# Patient Record
Sex: Female | Born: 1960 | Race: White | Hispanic: No | Smoking: Never smoker
Health system: Southern US, Community
[De-identification: ages and names within clinical notes are randomized; demographics above are authoritative.]

## PROBLEM LIST (undated history)

## (undated) DIAGNOSIS — R519 Headache, unspecified: Secondary | ICD-10-CM

## (undated) DIAGNOSIS — R1310 Dysphagia, unspecified: Secondary | ICD-10-CM

---

## 2004-06-04 ENCOUNTER — Emergency Department: Payer: Self-pay | Admitting: Emergency Medicine

## 2005-04-17 ENCOUNTER — Emergency Department: Payer: Self-pay | Admitting: Emergency Medicine

## 2005-04-18 ENCOUNTER — Other Ambulatory Visit: Payer: Self-pay

## 2006-07-04 ENCOUNTER — Other Ambulatory Visit: Payer: Self-pay

## 2006-07-04 ENCOUNTER — Emergency Department: Payer: Self-pay | Admitting: Emergency Medicine

## 2009-08-20 ENCOUNTER — Ambulatory Visit: Payer: Self-pay | Admitting: Internal Medicine

## 2009-09-03 ENCOUNTER — Ambulatory Visit: Payer: Self-pay | Admitting: Internal Medicine

## 2009-11-16 ENCOUNTER — Emergency Department: Payer: Self-pay | Admitting: Emergency Medicine

## 2010-01-22 ENCOUNTER — Ambulatory Visit: Payer: Self-pay | Admitting: Internal Medicine

## 2010-08-26 ENCOUNTER — Ambulatory Visit: Payer: Self-pay | Admitting: Internal Medicine

## 2011-10-15 ENCOUNTER — Emergency Department: Payer: Self-pay | Admitting: Unknown Physician Specialty

## 2011-10-15 LAB — URINALYSIS, COMPLETE
Blood: NEGATIVE
Ketone: NEGATIVE
Leukocyte Esterase: NEGATIVE
Nitrite: NEGATIVE
Ph: 5 (ref 4.5–8.0)
Protein: NEGATIVE
RBC,UR: 1 /HPF (ref 0–5)
Specific Gravity: 1.02 (ref 1.003–1.030)
Squamous Epithelial: 4

## 2011-10-15 LAB — COMPREHENSIVE METABOLIC PANEL
Alkaline Phosphatase: 156 U/L — ABNORMAL HIGH (ref 50–136)
Anion Gap: 9 (ref 7–16)
BUN: 8 mg/dL (ref 7–18)
Calcium, Total: 9.2 mg/dL (ref 8.5–10.1)
Chloride: 105 mmol/L (ref 98–107)
EGFR (Non-African Amer.): >60
Glucose: 107 mg/dL — ABNORMAL HIGH (ref 65–99)
Osmolality: 282 (ref 275–301)
SGOT(AST): 28 U/L (ref 15–37)
SGPT (ALT): 30 U/L
Sodium: 142 mmol/L (ref 136–145)
Total Protein: 7.6 g/dL (ref 6.4–8.2)

## 2011-10-15 LAB — CBC WITH DIFFERENTIAL/PLATELET
Basophil %: 0.4 %
Eosinophil %: 4.7 %
HCT: 41 % (ref 35.0–47.0)
Lymphocyte #: 1.7 10*3/uL (ref 1.0–3.6)
Lymphocyte %: 27.5 %
MCH: 30.6 pg (ref 26.0–34.0)
MCHC: 34 g/dL (ref 32.0–36.0)
MCV: 90 fL (ref 80–100)
Monocyte #: 0.4 10*3/uL (ref 0.0–0.7)
Monocyte %: 7 %
Neutrophil #: 3.7 10*3/uL (ref 1.4–6.5)
Neutrophil %: 60.4 %
Platelet: 224 10*3/uL (ref 150–440)
RDW: 12.8 % (ref 11.5–14.5)

## 2011-10-16 LAB — PRO B NATRIURETIC PEPTIDE: B-Type Natriuretic Peptide: 57 pg/mL (ref 0–125)

## 2012-05-12 ENCOUNTER — Ambulatory Visit: Payer: Self-pay

## 2013-11-18 ENCOUNTER — Ambulatory Visit: Payer: Self-pay | Admitting: Emergency Medicine

## 2015-07-31 ENCOUNTER — Ambulatory Visit
Admission: EM | Admit: 2015-07-31 | Discharge: 2015-07-31 | Disposition: A | Discharge status: Home / Self Care | Payer: BLUE CROSS/BLUE SHIELD | Attending: Family Medicine | Admitting: Family Medicine

## 2015-07-31 ENCOUNTER — Ambulatory Visit (INDEPENDENT_AMBULATORY_CARE_PROVIDER_SITE_OTHER): Payer: BLUE CROSS/BLUE SHIELD

## 2015-07-31 ENCOUNTER — Encounter: Payer: Self-pay | Admitting: *Deleted

## 2015-07-31 DIAGNOSIS — R059 Cough, unspecified: Secondary | ICD-10-CM

## 2015-07-31 DIAGNOSIS — R05 Cough: Secondary | ICD-10-CM

## 2015-07-31 MED ORDER — PREDNISONE 20 MG PO TABS: 20.0000 mg | ORAL_TABLET | Freq: Every day | ORAL | Status: DC

## 2015-07-31 MED ORDER — GUAIFENESIN-CODEINE 100-10 MG/5ML PO SOLN: ORAL | Status: DC

## 2015-07-31 MED ORDER — AZITHROMYCIN 250 MG PO TABS: ORAL_TABLET | ORAL | Status: DC

## 2015-07-31 NOTE — ED Notes (Signed)
Pt states that she has a headache, sore throat, cough, back pain and chest pain.  Pt states that she has already been given 3 rounds of antibiotics, minocycline, in the past 4 weeks.

## 2015-07-31 NOTE — ED Provider Notes (Signed)
CSN: [id number]     Arrival date & time 07/31/15  1834 History   First MD Initiated Contact with Patient 07/31/15 1911     Chief Complaint  Patient presents with  . Cough   (Consider location/radiation/quality/duration/timing/severity/associated sxs/prior Treatment) Patient is a 55 y.o. female presenting with URI. The history is provided by the patient.  URI Presenting symptoms: cough, fatigue and fever   Severity:  Moderate Onset quality:  Sudden Duration:  3 weeks Timing:  Constant Progression:  Unchanged Chronicity:  New Ineffective treatments:  Prescription medications and OTC medications (states has done 3 rounds of minocycline antibiotic) Associated symptoms: headaches and wheezing (mild)   Associated symptoms: no sinus pain and no sneezing     History reviewed. No pertinent past medical history. History reviewed. No pertinent past surgical history. No family history on file. Social History  Substance Use Topics  . Smoking status: Never Smoker   . Smokeless tobacco: None  . Alcohol Use: No   OB History    No data available     Review of Systems  Constitutional: Positive for fever and fatigue.  HENT: Negative for sneezing.   Respiratory: Positive for cough and wheezing (mild).   Neurological: Positive for headaches.    Allergies  Penicillins  Home Medications   Prior to Admission medications   Medication Sig Start Date End Date Taking? Authorizing Provider  minocycline (DYNACIN) 100 MG tablet Take 100 mg by mouth 2 (two) times daily.   Yes Historical Provider, MD  azithromycin (ZITHROMAX Z-PAK) 250 MG tablet 2 tabs po once day 1, then 1 tab po qd for next 4 days 07/31/15   Payton Mccallum, MD  guaiFENesin-codeine 100-10 MG/5ML syrup 10 ml po q 8 hours prn 07/31/15   Payton Mccallum, MD  predniSONE (DELTASONE) 20 MG tablet Take 1 tablet (20 mg total) by mouth daily. 07/31/15   Payton Mccallum, MD   Meds Ordered and Administered this Visit  Medications - No data to  display  BP 151/86 mmHg  Pulse 100  Temp(Src) 100.9 F (38.3 C) (Tympanic)  Ht 5\' 5"  (1.651 m)  Wt 190 lb (86.183 kg)  BMI 31.62 kg/m2  SpO2 94%  LMP  No data found.   Physical Exam  Constitutional: She appears well-developed and well-nourished. No distress.  HENT:  Head: Normocephalic and atraumatic.  Right Ear: Tympanic membrane, external ear and ear canal normal.  Left Ear: Tympanic membrane, external ear and ear canal normal.  Nose: No nose lacerations, sinus tenderness, nasal deformity, septal deviation or nasal septal hematoma. No epistaxis.  No foreign bodies.  Mouth/Throat: Uvula is midline, oropharynx is clear and moist and mucous membranes are normal. No oropharyngeal exudate, posterior oropharyngeal edema, posterior oropharyngeal erythema or tonsillar abscesses.  Eyes: Conjunctivae and EOM are normal. Pupils are equal, round, and reactive to light. Right eye exhibits no discharge. Left eye exhibits no discharge. No scleral icterus.  Neck: Normal range of motion. Neck supple. No thyromegaly present.  Cardiovascular: Normal rate, regular rhythm and normal heart sounds.   Pulmonary/Chest: Effort normal. No respiratory distress. She has no wheezes. She has no rales.  Diffuse rhonchi and mild expiratory wheezes bilaterally  Lymphadenopathy:    She has no cervical adenopathy.  Skin: She is not diaphoretic.  Nursing note and vitals reviewed.   ED Course  Procedures (including critical care time)  Labs Review Labs Reviewed - No data to display  Imaging Review Dg Chest 2 View  07/31/2015  CLINICAL DATA:  Productive cough.  Fever. Chest tightness. Wheezing and shortness of breath for 4 weeks. EXAM: CHEST  2 VIEW COMPARISON:  None. FINDINGS: The heart size and mediastinal contours are within normal limits. Both lungs are clear. No evidence of pneumothorax or pleural effusion. Thoracic spine degenerative changes noted. IMPRESSION: No active cardiopulmonary disease.  Electronically Signed   By: Myles RosenthalJohn  Stahl M.D.   On: 07/31/2015 19:40     Visual Acuity Review  Right Eye Distance:   Left Eye Distance:   Bilateral Distance:    Right Eye Near:   Left Eye Near:    Bilateral Near:         MDM   1. Cough    Discharge Medication List as of 07/31/2015  8:01 PM    START taking these medications   Details  azithromycin (ZITHROMAX Z-PAK) 250 MG tablet 2 tabs po once day 1, then 1 tab po qd for next 4 days, Print    guaiFENesin-codeine 100-10 MG/5ML syrup 10 ml po q 8 hours prn, Print    predniSONE (DELTASONE) 20 MG tablet Take 1 tablet (20 mg total) by mouth daily., Starting 07/31/2015, Until Discontinued, Print       1. x-ray results and diagnosis reviewed with patient 2. rx as per orders above; reviewed possible side effects, interactions, risks and benefits  3. Recommend supportive treatment with rest, increased fluids; continue albuterol inhaler prn 4. Follow-up prn if symptoms worsen or don't improve     Payton Mccallumrlando Tuesday Terlecki, MD 07/31/15 2033

## 2015-11-27 ENCOUNTER — Encounter: Payer: Self-pay | Admitting: Gynecology

## 2015-11-27 ENCOUNTER — Ambulatory Visit
Admission: EM | Admit: 2015-11-27 | Discharge: 2015-11-27 | Disposition: A | Discharge status: Home / Self Care | Payer: BLUE CROSS/BLUE SHIELD | Attending: Family Medicine | Admitting: Family Medicine

## 2015-11-27 ENCOUNTER — Ambulatory Visit (INDEPENDENT_AMBULATORY_CARE_PROVIDER_SITE_OTHER): Payer: BLUE CROSS/BLUE SHIELD

## 2015-11-27 DIAGNOSIS — J40 Bronchitis, not specified as acute or chronic: Secondary | ICD-10-CM

## 2015-11-27 MED ORDER — LEVOFLOXACIN 500 MG PO TABS: 500.0000 mg | ORAL_TABLET | Freq: Every day | ORAL | Status: DC

## 2015-11-27 MED ORDER — ALBUTEROL SULFATE HFA 108 (90 BASE) MCG/ACT IN AERS
1.0000 | INHALATION_SPRAY | Freq: Four times a day (QID) | RESPIRATORY_TRACT | Status: DC | PRN
Start: 2015-11-27 — End: 2020-06-13

## 2015-11-27 NOTE — ED Notes (Signed)
Patient c/o cough / chest congestion.

## 2015-11-27 NOTE — ED Provider Notes (Signed)
CSN: [id number]     Arrival date & time 11/27/15  1726 History   First MD Initiated Contact with Patient 11/27/15 1825     Chief Complaint  Patient presents with  . Cough   (Consider location/radiation/quality/duration/timing/severity/associated sxs/prior Treatment) HPI: Patient presents today with symptoms of chest congestion, wheezing for the last several weeks. Patient states that she's had a few [facility] She believes the antibiotic that she has been on his minocycline. She admits to a productive cough. She denies any fever, chest pain or shortness of breath. She does not have a history of asthma. She does not smoke. She denies any history of heart failure. She denies any other new medications recently. She has been taking her Allegra for allergy symptoms.  History reviewed. No pertinent past medical history. History reviewed. No pertinent past surgical history. No family history on file. Social History  Substance Use Topics  . Smoking status: Never Smoker   . Smokeless tobacco: None  . Alcohol Use: No   OB History    No data available     Review of Systems: Negative except mentioned above.   Allergies  Penicillins  Home Medications   Prior to Admission medications   Medication Sig Start Date End Date Taking? Authorizing Provider  azithromycin (ZITHROMAX Z-PAK) 250 MG tablet 2 tabs po once day 1, then 1 tab po qd for next 4 days 07/31/15   Payton Mccallumrlando Conty, MD  guaiFENesin-codeine 100-10 MG/5ML syrup 10 ml po q 8 hours prn 07/31/15   Payton Mccallumrlando Conty, MD  minocycline (DYNACIN) 100 MG tablet Take 100 mg by mouth 2 (two) times daily.    Historical Provider, MD  predniSONE (DELTASONE) 20 MG tablet Take 1 tablet (20 mg total) by mouth daily. 07/31/15   Payton Mccallumrlando Conty, MD   Meds Ordered and Administered this Visit  Medications - No data to display  BP 157/102 mmHg  Pulse 66  Temp(Src) 98.4 F (36.9 C) (Oral)  Resp 16  Ht 5\' 5"  (1.651 m)   SpO2 97% No data found.   Physical Exam:  GENERAL: NAD HEENT: mild pharyngeal erythema, no exudate, no erythema of TMs, no cervical LAD RESP: mild expiratory wheeze, no accessory muscle use CARD: RRR NEURO: CN II-XII grossly intact   ED Course  Procedures (including critical care time)  Labs Review Labs Reviewed - No data to display  Imaging Review No results found.   MDM  A/P: Bronchitis- X-ray results discussed with patient, will prescribe patient Levaquin, Albuterol MDI when necessary, Delsym when necessary, continue oral allergy medication, follow-up with primary care physician and/or pulmonology if symptoms do persist or worsen as discussed.    Jolene ProvostKirtida Jaylen Claude, MD 11/27/15 [phone]

## 2016-02-28 ENCOUNTER — Ambulatory Visit
Admission: EM | Admit: 2016-02-28 | Discharge: 2016-02-28 | Disposition: A | Discharge status: Home / Self Care | Payer: BLUE CROSS/BLUE SHIELD | Attending: Emergency Medicine | Admitting: Emergency Medicine

## 2016-02-28 DIAGNOSIS — T7840XA Allergy, unspecified, initial encounter: Secondary | ICD-10-CM | POA: Diagnosis not present

## 2016-02-28 MED ORDER — EPINEPHRINE 0.3 MG/0.3ML IJ SOAJ: 0.3000 mg | Freq: Once | INTRAMUSCULAR | 1 refills | Status: AC

## 2016-02-28 MED ORDER — FAMOTIDINE 40 MG PO TABS
40.0000 mg | ORAL_TABLET | Freq: Once | ORAL | Status: AC
  Administered 2016-02-28: 40 mg via ORAL

## 2016-02-28 MED ORDER — PREDNISONE 50 MG PO TABS: ORAL_TABLET | ORAL | 0 refills | Status: DC

## 2016-02-28 MED ORDER — IPRATROPIUM BROMIDE 0.06 % NA SOLN: 2.0000 | Freq: Four times a day (QID) | NASAL | 0 refills | Status: DC

## 2016-02-28 MED ORDER — METHYLPREDNISOLONE SODIUM SUCC 125 MG IJ SOLR
125.0000 mg | Freq: Once | INTRAMUSCULAR | Status: AC
  Administered 2016-02-28: 125 mg via INTRAMUSCULAR

## 2016-02-28 NOTE — Discharge Instructions (Signed)
Take 10 mg of Claritin and 40 mg of Pepcid along with the steroids for the next 4 days. You can start the steroids tomorrow. Take some claritin today. [facility]Pen [facility] [place]. If your symptoms return, use the EpiPen and go immediately to the ER. Start using some saline nasal irrigation with a Lloyd HugerNeil med rinse or neti pot and restart the Flonase. You may also try the Atrovent to help with the your nasal congestion and postnasal drip.

## 2016-02-28 NOTE — ED Provider Notes (Signed)
HPI  SUBJECTIVE:  Kiara Perez is a 55 y.o. female who presents with the acute onset of throat swelling, states that she "couldn't breathe", facial swelling, cough, diffuse erythema over her face, neck, arms, legs and urticaria over her arms and upper thighs. This occurred this morning while at work.  she states that she woke up with some nasal congestion, but has otherwise been in her usual state of health. She states that she is itching "everywhere" including her head. She tried her inhaler 1 puff and applied ice on the urticaria with improvement in her symptoms. There are no aggravating factors. She states that she did not eat breakfast this morning, but had a sip of coke. She has not had anything else to eat or drink. No new lotions, soaps, detergents, foods. She does not take any medicines on a regular basis. No known environmental triggers. No exposure to perfumes. No voice changes, wheezing, chest pain, shortness of breath, no abdominal pain, diarrhea, presyncope, syncope. No known sting or insect bite. No antibiotics in the past month. She does not exercise this morning. She states that she did drive here today. Past medical history of Cushing's syndrome, migraines. No history of anaphylaxis, atopy, asthma but she has inhalers for URIs. No history of diabetes, hypertension, she is not on any Ace inhibitors. PMD: Dr. Dayna BarkerAldridge and Wel[facility] FlakesFreuh    History reviewed. No pertinent past medical history.  History reviewed. No pertinent surgical history.  History reviewed. No pertinent family history.  Social History  Substance Use Topics  . Smoking status: Never Smoker  . Smokeless tobacco: Never Used  . Alcohol use No    No current facility-administered medications for this encounter.   Current Outpatient Prescriptions:  .  albuterol (PROVENTIL HFA;VENTOLIN HFA) 108 (90 Base) MCG/ACT inhaler, Inhale 1-2 puffs into the lungs every 6 (six) hours as needed for wheezing or shortness of breath.,  Disp: 1 Inhaler, Rfl: 0 .  EPINEPHrine 0.3 mg/0.3 mL IJ SOAJ injection, Inject 0.3 mLs (0.3 mg total) into the muscle once., Disp: 1 Device, Rfl: 1 .  ipratropium (ATROVENT) 0.06 % nasal spray, Place 2 sprays into both nostrils 4 (four) times daily. 3-4 times/ day, Disp: 15 mL, Rfl: 0 .  predniSONE (DELTASONE) 50 MG tablet, 1 tab po on day 1, one tab po on day 2, 1/2 tab po on days 3 and 4, Disp: 3 tablet, Rfl: 0  Allergies  Allergen Reactions  . Penicillins Rash     ROS  As noted in HPI.   Physical Exam  BP 140/88 (BP Location: Right Arm)   Pulse 82   Temp 98 F (36.7 C) (Oral)   Resp 18   SpO2 98%   Constitutional: Well developed, well nourished, no acute distress Normal voice.  Eyes:  EOMI, conjunctiva normal bilaterally HENT: Normocephalic, atraumatic,mucus membranes moist. no angioedema of lips or tongue. Uvula midline. Airway widely patent. Positive clear nasal congestion,  no sinus tenderness Respiratory: Normal inspiratory effort lungs clear bilaterally good air movement Cardiovascular: Normal rate regular rhythm, no murmurs, rubs, gallops GI: nondistended skin: Diffuse erythema over bilateral arms which patient is scratching. No apparent urticaria. No apparent facial swelling. Musculoskeletal: no deformities Neurologic: Alert & oriented x 3, no focal neuro deficits Psychiatric: Speech and behavior appropriate   ED Course   Medications  methylPREDNISolone sodium succinate (SOLU-MEDROL) 125 mg/2 mL injection 125 mg (125 mg Intramuscular Given 02/28/16 1125)  famotidine (PEPCID) tablet 40 mg (40 mg Oral Given 02/28/16 1125)  No orders of the defined types were placed in this encounter.   No results found for this or any previous visit (from the past 24 hour(s)). No results found.  ED Clinical Impression  Allergic reaction, initial encounter   ED Assessment/Plan  Presentation is suggestive of an allergic reaction although to an unknown agent. There is no  evidence of anaphylaxis. Gave Solu-Medrol 125 mg IM here and 40 mg of Pepcid. Patient is driving, so Benadryl was withheld. Plan to send home with Pepcid for 5 days, Claritin 10 mg daily for 5 days, prednisone taper, and an EpiPen. Advised her to restart Flonase and saline nasal irrigation for her nasal congestion. We'll also try Atrovent nasal spray.  on reevaluation, patient states she feels better. Airway still widely patent.  Discussed MDM, plan and followup with patient. Discussed sn/sx that should prompt return to the ED. Patient agrees with plan.   *This clinic note was created using Dragon dictation software. Therefore, there may be occasional mistakes despite careful proofreading.  ?   Domenick Gong, MD 02/28/16 1325

## 2016-02-28 NOTE — ED Notes (Signed)
Patient reports that she is no longer itching and redness on her arms have improved.  Patient shows no other signs of adverse reaction to medication at this time.

## 2016-02-28 NOTE — ED Triage Notes (Signed)
Patient complains of having an allergic reactions to something, she denies any new contacts or foods today. She complains of itching on the forearms, legs and neck. She did use her sons inhaler and it helped with the breathing.

## 2016-05-17 ENCOUNTER — Encounter
Admission: EM | Disposition: A | Discharge status: Home / Self Care | Payer: Self-pay | Source: Home / Self Care | Attending: Student in an Organized Health Care Education/Training Program

## 2016-05-17 ENCOUNTER — Emergency Department: Payer: BLUE CROSS/BLUE SHIELD | Admitting: Anesthesiology

## 2016-05-17 ENCOUNTER — Encounter: Payer: Self-pay | Admitting: Anesthesiology

## 2016-05-17 ENCOUNTER — Emergency Department
Admission: EM | Admit: 2016-05-17 | Discharge: 2016-05-17 | Disposition: A | Discharge status: Home / Self Care | Payer: BLUE CROSS/BLUE SHIELD | Attending: Student in an Organized Health Care Education/Training Program | Admitting: Student in an Organized Health Care Education/Training Program

## 2016-05-17 ENCOUNTER — Emergency Department: Payer: BLUE CROSS/BLUE SHIELD

## 2016-05-17 DIAGNOSIS — K219 Gastro-esophageal reflux disease without esophagitis: Secondary | ICD-10-CM | POA: Insufficient documentation

## 2016-05-17 DIAGNOSIS — Z880 Allergy status to penicillin: Secondary | ICD-10-CM | POA: Insufficient documentation

## 2016-05-17 DIAGNOSIS — T18128A Food in esophagus causing other injury, initial encounter: Secondary | ICD-10-CM | POA: Insufficient documentation

## 2016-05-17 DIAGNOSIS — T18108A Unspecified foreign body in esophagus causing other injury, initial encounter: Secondary | ICD-10-CM

## 2016-05-17 DIAGNOSIS — X58XXXA Exposure to other specified factors, initial encounter: Secondary | ICD-10-CM | POA: Insufficient documentation

## 2016-05-17 LAB — COMPREHENSIVE METABOLIC PANEL
ALBUMIN: 4.6 g/dL (ref 3.5–5.0)
ALT: 16 U/L (ref 14–54)
AST: 25 U/L (ref 15–41)
Alkaline Phosphatase: 132 U/L — ABNORMAL HIGH (ref 38–126)
Anion gap: 9 (ref 5–15)
BUN: 9 mg/dL (ref 6–20)
CHLORIDE: 110 mmol/L (ref 101–111)
CO2: 22 mmol/L (ref 22–32)
Calcium: 9.6 mg/dL (ref 8.9–10.3)
Creatinine, Ser: 0.79 mg/dL (ref 0.44–1.00)
GFR calc Af Amer: >60 mL/min (ref >60)
GFR calc non Af Amer: >60 mL/min (ref >60)
GLUCOSE: 119 mg/dL — AB (ref 65–99)
POTASSIUM: 3.8 mmol/L (ref 3.5–5.1)
Sodium: 141 mmol/L (ref 135–145)
Total Bilirubin: 0.5 mg/dL (ref 0.3–1.2)
Total Protein: 8 g/dL (ref 6.5–8.1)

## 2016-05-17 LAB — CBC WITH DIFFERENTIAL/PLATELET
BASOS ABS: 0 10*3/uL (ref 0–0.1)
BASOS PCT: 0 %
EOS ABS: 0.3 10*3/uL (ref 0–0.7)
EOS PCT: 4 %
HCT: 45.1 % (ref 35.0–47.0)
Hemoglobin: 15.2 g/dL (ref 12.0–16.0)
Lymphocytes Relative: 11 %
Lymphs Abs: 1 10*3/uL (ref 1.0–3.6)
MCH: 30.4 pg (ref 26.0–34.0)
MCHC: 33.7 g/dL (ref 32.0–36.0)
MCV: 90.2 fL (ref 80.0–100.0)
MONO ABS: 0.3 10*3/uL (ref 0.2–0.9)
Monocytes Relative: 4 %
Neutro Abs: 7.4 10*3/uL — ABNORMAL HIGH (ref 1.4–6.5)
Neutrophils Relative %: 81 %
PLATELETS: 237 10*3/uL (ref 150–440)
RBC: 5 MIL/uL (ref 3.80–5.20)
RDW: 12.6 % (ref 11.5–14.5)
WBC: 9.1 10*3/uL (ref 3.6–11.0)

## 2016-05-17 LAB — TROPONIN I

## 2016-05-17 SURGERY — ESOPHAGOGASTRODUODENOSCOPY (EGD) WITH PROPOFOL
Anesthesia: General

## 2016-05-17 MED ORDER — LIDOCAINE HCL (CARDIAC) 20 MG/ML IV SOLN
INTRAVENOUS | Status: DC | PRN
  Administered 2016-05-17: 30 mg via INTRAVENOUS

## 2016-05-17 MED ORDER — SUCCINYLCHOLINE CHLORIDE 20 MG/ML IJ SOLN
INTRAMUSCULAR | Status: DC | PRN
  Administered 2016-05-17: 100 mg via INTRAVENOUS

## 2016-05-17 MED ORDER — FENTANYL CITRATE (PF) 100 MCG/2ML IJ SOLN: 25.0000 ug | INTRAMUSCULAR | Status: DC | PRN

## 2016-05-17 MED ORDER — PROPOFOL 10 MG/ML IV BOLUS
INTRAVENOUS | Status: DC | PRN
  Administered 2016-05-17: 100 mg via INTRAVENOUS

## 2016-05-17 MED ORDER — GLYCOPYRROLATE 0.2 MG/ML IJ SOLN
INTRAMUSCULAR | Status: DC | PRN
  Administered 2016-05-17: 0.2 mg via INTRAVENOUS

## 2016-05-17 MED ORDER — SODIUM CHLORIDE 0.9 % IV SOLN
INTRAVENOUS | Status: DC | PRN
  Administered 2016-05-17: 19:00:00 via INTRAVENOUS

## 2016-05-17 MED ORDER — ONDANSETRON HCL 4 MG/2ML IJ SOLN: 4.0000 mg | Freq: Once | INTRAMUSCULAR | Status: DC | PRN

## 2016-05-17 MED ORDER — GLUCAGON HCL (RDNA) 1 MG IJ SOLR
1.0000 mg | Freq: Once | INTRAMUSCULAR | Status: AC
  Administered 2016-05-17: 1 mg via INTRAVENOUS
  Filled 2016-05-17 (×2): qty 1

## 2016-05-17 MED ORDER — PROMETHAZINE HCL 25 MG/ML IJ SOLN
12.5000 mg | Freq: Once | INTRAMUSCULAR | Status: AC
Start: 2016-05-17 — End: 2016-05-17
  Administered 2016-05-17: 12.5 mg via INTRAVENOUS
  Filled 2016-05-17: qty 1

## 2016-05-17 MED ORDER — GLUCAGON HCL RDNA (DIAGNOSTIC) 1 MG IJ SOLR
INTRAMUSCULAR | Status: AC
  Filled 2016-05-17: qty 1

## 2016-05-17 NOTE — Anesthesia Preprocedure Evaluation (Addendum)
Anesthesia Evaluation  Patient identified by MRN, date of birth, ID band Patient awake    Reviewed: Allergy & Precautions, NPO status , Patient's Chart, lab work & pertinent test results, reviewed documented beta blocker date and time   Airway Mallampati: II  TM Distance: >3 FB     Dental  (+) Chipped   Pulmonary           Cardiovascular      Neuro/Psych    GI/Hepatic   Endo/Other    Renal/GU      Musculoskeletal   Abdominal   Peds  Hematology   Anesthesia Other Findings Bps are high. EKG ok.  Reproductive/Obstetrics                            Anesthesia Physical Anesthesia Plan  ASA: II  Anesthesia Plan: General   Post-op Pain Management:    Induction: Intravenous  Airway Management Planned: Oral ETT  Additional Equipment:   Intra-op Plan:   Post-operative Plan:   Informed Consent: I have reviewed the patients History and Physical, chart, labs and discussed the procedure including the risks, benefits and alternatives for the proposed anesthesia with the patient or authorized representative who has indicated his/her understanding and acceptance.     Plan Discussed with: CRNA  Anesthesia Plan Comments:         Anesthesia Quick Evaluation

## 2016-05-17 NOTE — ED Triage Notes (Signed)
Pt to ED by EMS with c/o of chest pain with n/v. Pt was eating lunch and after the 3rd bite went to the restroom and began vomiting up "thick and gooey" bile. The pt states she has a history of reflux but that she feel like something is stuck. Pt states pain is located in the central part of her chest and radiates under both breasts and to her lower back.

## 2016-05-17 NOTE — ED Provider Notes (Signed)
[facility] Emergency Department Provider Note    First MD Initiated Contact with Patient 05/17/16 1809     (approximate)  I have reviewed the triage vital signs and the nursing notes.   HISTORY  Chief Complaint Chest Pain    HPI Kiara Perez is a 55 y.o. female who presents with sudden onset midsternal chest pain after eating country fried steak and can W. Patient states that on the third bite she had sudden onset of pain and felt like she needed to vomit. States that she vomited some thick secretions but since then has felt that it's been stuck. She tried drinking Coke and water but was unable to keep anything down. She tried taking Pepto at home as I have irrigated down. Has a persistent feeling that something is stuck in her throat. Patient came to the ER due to persistent discomfort.  She does have a history of gastritis and reflux. She's never had an endoscopy performed.   History reviewed. No pertinent past medical history.  There are no active problems to display for this patient.   History reviewed. No pertinent surgical history.  Prior to Admission medications   Medication Sig Start Date End Date Taking? Authorizing Provider  albuterol (PROVENTIL HFA;VENTOLIN HFA) 108 (90 Base) MCG/ACT inhaler Inhale 1-2 puffs into the lungs every 6 (six) hours as needed for wheezing or shortness of breath. 11/27/15   Jolene ProvostKirtida Patel, MD  ipratropium (ATROVENT) 0.06 % nasal spray Place 2 sprays into both nostrils 4 (four) times daily. 3-4 times/ day 02/28/16   Domenick GongAshley Mortenson, MD  predniSONE (DELTASONE) 50 MG tablet 1 tab po on day 1, one tab po on day 2, 1/2 tab po on days 3 and 4 02/28/16   Domenick GongAshley Mortenson, MD    Allergies Penicillins  No family history on file.  Social History Social History  Substance Use Topics  . Smoking status: Never Smoker  . Smokeless tobacco: Never Used  . Alcohol use No    Review of Systems Patient denies headaches,  rhinorrhea, blurry vision, numbness, shortness of breath, chest pain, edema, cough, abdominal pain, nausea, vomiting, diarrhea, dysuria, fevers, rashes or hallucinations unless otherwise stated above in HPI. ____________________________________________   PHYSICAL EXAM:  VITAL SIGNS: There were no vitals filed for this visit.  Constitutional: Alert and oriented. Ill appearing, having difficulty tolerating her secretions Eyes: Conjunctivae are normal. PERRL. EOMI. Head: Atraumatic. Nose: No congestion/rhinnorhea. Mouth/Throat: Mucous membranes are moist.  Oropharynx non-erythematous. Neck: No stridor. Painless ROM. No cervical spine tenderness to palpation Hematological/Lymphatic/Immunilogical: No cervical lymphadenopathy. Cardiovascular: Normal rate, regular rhythm. Grossly normal heart sounds.  Good peripheral circulation. Respiratory: Normal respiratory effort.  No retractions. Lungs CTAB. Gastrointestinal: mild epigastric ttp. No distention. No abdominal bruits. No CVA tenderness. Genitourinary:  Musculoskeletal: No lower extremity tenderness nor edema.  No joint effusions. Neurologic:  Normal speech and language. No gross focal neurologic deficits are appreciated. No gait instability. Skin:  Skin is warm, dry and intact. No rash noted. Psychiatric: Mood and affect are normal. Speech and behavior are normal.  ____________________________________________   LABS (all labs ordered are listed, but only abnormal results are displayed)  Results for orders placed or performed during the hospital encounter of 05/17/16 (from the past 24 hour(s))  CBC with Differential/Platelet     Status: Abnormal   Collection Time: 05/17/16  6:54 PM  Result Value Ref Range   WBC 9.1 3.6 - 11.0 K/uL   RBC 5.00 3.80 - 5.20 MIL/uL  Hemoglobin 15.2 12.0 - 16.0 g/dL   HCT 16.145.1 09.635.0 - 04.547.0 %   MCV 90.2 80.0 - 100.0 fL   MCH 30.4 26.0 - 34.0 pg   MCHC 33.7 32.0 - 36.0 g/dL   RDW 40.912.6 81.111.5 - 91.414.5 %    Platelets 237 150 - 440 K/uL   Neutrophils Relative % 81 %   Neutro Abs 7.4 (H) 1.4 - 6.5 K/uL   Lymphocytes Relative 11 %   Lymphs Abs 1.0 1.0 - 3.6 K/uL   Monocytes Relative 4 %   Monocytes Absolute 0.3 0.2 - 0.9 K/uL   Eosinophils Relative 4 %   Eosinophils Absolute 0.3 0 - 0.7 K/uL   Basophils Relative 0 %   Basophils Absolute 0.0 0 - 0.1 K/uL  Comprehensive metabolic panel     Status: Abnormal   Collection Time: 05/17/16  6:54 PM  Result Value Ref Range   Sodium 141 135 - 145 mmol/L   Potassium 3.8 3.5 - 5.1 mmol/L   Chloride 110 101 - 111 mmol/L   CO2 22 22 - 32 mmol/L   Glucose, Bld 119 (H) 65 - 99 mg/dL   BUN 9 6 - 20 mg/dL   Creatinine, Ser 7.820.79 0.44 - 1.00 mg/dL   Calcium 9.6 8.9 - 95.610.3 mg/dL   Total Protein 8.0 6.5 - 8.1 g/dL   Albumin 4.6 3.5 - 5.0 g/dL   AST 25 15 - 41 U/L   ALT 16 14 - 54 U/L   Alkaline Phosphatase 132 (H) 38 - 126 U/L   Total Bilirubin 0.5 0.3 - 1.2 mg/dL   GFR calc non Af Amer >60 >60 mL/min   GFR calc Af Amer >60 >60 mL/min   Anion gap 9 5 - 15  Troponin I     Status: None   Collection Time: 05/17/16  6:54 PM  Result Value Ref Range   Troponin I <0.03 <0.03 ng/mL   ____________________________________________  EKG My review and personal interpretation at Time: 19:00   Indication: chest pain  Rate: 105  Rhythm: nsr Axis: normal Other: no acute ischemia ____________________________________________  RADIOLOGY  I personally reviewed all radiographic images ordered to evaluate for the above acute complaints and reviewed radiology reports and findings.  These findings were personally discussed with the patient.  Please see medical record for radiology report. ____________________________________________   PROCEDURES  Procedure(s) performed: none    Critical Care performed: yes CRITICAL CARE Performed by: Willy EddyPatrick Tanija Germani   Total critical care time: 35 minutes  Critical care time was exclusive of separately billable procedures  and treating other patients.  Critical care was necessary to treat or prevent imminent or life-threatening deterioration.  Critical care was time spent personally by me on the following activities: development of treatment plan with patient and/or surrogate as well as nursing, discussions with consultants, evaluation of patient's response to treatment, examination of patient, obtaining history from patient or surrogate, ordering and performing treatments and interventions, ordering and review of laboratory studies, ordering and review of radiographic studies, pulse oximetry and re-evaluation of patient's condition.  ____________________________________________   INITIAL IMPRESSION / ASSESSMENT AND PLAN / ED COURSE  Pertinent labs & imaging results that were available during my care of the patient were reviewed by me and considered in my medical decision making (see chart for details).  DDX: Food bolus impaction, ACS, thorax, mediastinitis, esophagitis  Kiara Perez is a 55 y.o. who presents to the ED with complaint of severe chest pain and inability to  swallow after eating steak today. Patient arrives afebrile but tachycardic. At the more she is tolerating her secretions but does have presentation concerning for 4 food bolus impaction. Does have a history of reflux which may precipitate impaction. EKG shows no evidence of acute ischemia. Stat chest x-ray ordered shows no evidence of pneumothorax pneumomediastinum. Placed emergent consult the GI for emergency endoscopy. Patient was provided with IV glucagon.   Clinical Course  Comment By Time  Patient in no respiratory distress but is still feeling that the food is stuck in her throat. Now feeling more comfortable after Phenergan dose. Willy Eddy, MD 10/29 1914   Patient taken to endoscopy suite for further management by GI. She left the department and hemodynamic stable.   ____________________________________________   FINAL  CLINICAL IMPRESSION(S) / ED DIAGNOSES  Final diagnoses:  Food impaction of esophagus, initial encounter      NEW MEDICATIONS STARTED DURING THIS VISIT:  New Prescriptions   No medications on file     Note:  This document was prepared using Dragon voice recognition software and may include unintentional dictation errors.    Willy Eddy, MD 05/17/16 2107

## 2016-05-17 NOTE — Anesthesia Procedure Notes (Signed)
Procedure Name: Intubation Date/Time: 05/17/2016 7:49 PM Performed by: Ginger CarneMICHELET, Eissa Buchberger Pre-anesthesia Checklist: Patient identified, Suction available, Emergency Drugs available, Patient being monitored and Timeout performed Patient Re-evaluated:Patient Re-evaluated prior to inductionOxygen Delivery Method: Circle system utilized Preoxygenation: Pre-oxygenation with 100% oxygen Intubation Type: IV induction Grade View: Grade I Tube type: Oral Tube size: 7.5 mm Number of attempts: 1 Airway Equipment and Method: Stylet Placement Confirmation: ETT inserted through vocal cords under direct vision,  positive ETCO2 and breath sounds checked- equal and bilateral Secured at: 20 cm Tube secured with: Tape Dental Injury: Teeth and Oropharynx as per pre-operative assessment  Difficulty Due To: Difficulty was anticipated

## 2016-05-17 NOTE — Discharge Instructions (Signed)
Chew your food well.

## 2016-05-17 NOTE — H&P (Signed)
  Kiara Miniumarren Dashanna Kinnamon, MD [facility] [address] [place], [place] [address] Phone: [phone] Fax : [phone]  Primary Care Physician:  No primary care provider on file. Primary Gastroenterologist:  Dr. Servando SnareWohl  Pre-Procedure History & Physical: HPI:  Kiara Perez is a 55 y.o. female is here for an endoscopy.   History reviewed. No pertinent past medical history.  History reviewed. No pertinent surgical history.  Prior to Admission medications   Medication Sig Start Date End Date Taking? Authorizing Provider  albuterol (PROVENTIL HFA;VENTOLIN HFA) 108 (90 Base) MCG/ACT inhaler Inhale 1-2 puffs into the lungs every 6 (six) hours as needed for wheezing or shortness of breath. 11/27/15   Jolene ProvostKirtida Patel, MD  ipratropium (ATROVENT) 0.06 % nasal spray Place 2 sprays into both nostrils 4 (four) times daily. 3-4 times/ day 02/28/16   Domenick GongAshley Mortenson, MD  predniSONE (DELTASONE) 50 MG tablet 1 tab po on day 1, one tab po on day 2, 1/2 tab po on days 3 and 4 02/28/16   Domenick GongAshley Mortenson, MD    Allergies as of 05/17/2016 - Review Complete 05/17/2016  Allergen Reaction Noted  . Penicillins Rash 07/31/2015    History reviewed. No pertinent family history.  Social History   Social History  . Marital status: Married    Spouse name: N/A  . Number of children: N/A  . Years of education: N/A   Occupational History  . Not on file.   Social History Main Topics  . Smoking status: Never Smoker  . Smokeless tobacco: Never Used  . Alcohol use No  . Drug use: No  . Sexual activity: Not on file   Other Topics Concern  . Not on file   Social History Narrative  . No narrative on file    Review of Systems: See HPI, otherwise negative ROS  Physical Exam: BP (!) 160/90   Pulse 87   Temp 98.5 F (36.9 C)   Resp 20   Ht 5\' 6"  (1.676 m)   Wt 190 lb (86.2 kg)   SpO2 96%   BMI 30.67 kg/m  General:   Alert,  pleasant and cooperative in NAD Head:  Normocephalic and atraumatic. Neck:   Supple; no masses or thyromegaly. Lungs:  Clear throughout to auscultation.    Heart:  Regular rate and rhythm. Abdomen:  Soft, nontender and nondistended. Normal bowel sounds, without guarding, and without rebound.   Neurologic:  Alert and  oriented x4;  grossly normal neurologically.  Impression/Plan: Kiara Perez is here for an endoscopy to be performed for food bolus  Risks, benefits, limitations, and alternatives regarding  endoscopy have been reviewed with the patient.  Questions have been answered.  All parties agreeable.   Kiara Miniumarren Saraia Platner, MD  05/17/2016, 9:33 PM

## 2016-05-17 NOTE — ED Notes (Signed)
Transported to OR for EGD and retrieval of food bolus.

## 2016-05-17 NOTE — Op Note (Signed)
[facility] Gastroenterology Patient Name: Carlis StableLori Bashor Procedure Date: 05/17/2016 7:35 PM MRN: [id number] Account #: [id number] Date of Birth: 01/30/1961 Admit Type: Outpatient Age: [age] [facility] Gender: Female Note Status: Finalized Procedure:            Upper GI endoscopy Indications:          Foreign body in the esophagus Providers:            Midge Miniumarren Koda Defrank MD, MD Referring MD:         Leim FabryBarbara Aldridge MD, MD (Referring MD) Medicines:            General Anesthesia Complications:        No immediate complications. Procedure:            Pre-Anesthesia Assessment:                       - Prior to the procedure, a History and Physical was                        performed, and patient medications and allergies were                        reviewed. The patient's tolerance of previous                        anesthesia was also reviewed. The risks and benefits of                        the procedure and the sedation options and risks were                        discussed with the patient. All questions were                        answered, and informed consent was obtained. Prior                        Anticoagulants: The patient has taken no previous                        anticoagulant or antiplatelet agents. ASA Grade                        Assessment: II - A patient with mild systemic disease.                        After reviewing the risks and benefits, the patient was                        deemed in satisfactory condition to undergo the                        procedure.                       After obtaining informed consent, the endoscope was                        passed under direct vision. Throughout the procedure,  the patient's blood pressure, pulse, and oxygen                        saturations were monitored continuously. The                        Colonoscope was introduced through the mouth, and   advanced to the second part of duodenum. The upper GI                        endoscopy was accomplished without difficulty. The                        patient tolerated the procedure well. Findings:      Food was found at the gastroesophageal junction. Removal was       accomplished with a Tripod.      The stomach was normal.      The examined duodenum was normal. Impression:           - Food was found in the esophagus. Removal was                        successful.                       - Normal stomach.                       - Normal examined duodenum. Recommendation:       - Discharge patient to home.                       - Resume previous diet. Procedure Code(s):    --- Professional ---                       [phone], Esophagogastroduodenoscopy, flexible, transoral;                        with removal of foreign body(s) Diagnosis Code(s):    --- Professional ---                       T18.108A, Unspecified foreign body in esophagus causing                        other injury, initial encounter CPT copyright 2016 American Medical Association. All rights reserved. The codes documented in this report are preliminary and upon coder review may  be revised to meet current compliance requirements. Midge Miniumarren Ahyan Kreeger MD, MD 05/17/2016 7:55:52 PM This report has been signed electronically. Number of Addenda: 0 Note Initiated On: 05/17/2016 7:35 PM      [facility]

## 2016-05-17 NOTE — Transfer of Care (Signed)
Immediate Anesthesia Transfer of Care Note  Patient: Edison NasutiLori A Mckinnon  Procedure(s) Performed: Procedure(s): ESOPHAGOGASTRODUODENOSCOPY (EGD) WITH PROPOFOL (N/A)  Patient Location: PACU  Anesthesia Type:General  Level of Consciousness: sedated  Airway & Oxygen Therapy: Patient Spontanous Breathing and Patient connected to nasal cannula oxygen  Post-op Assessment: Report given to RN and Post -op Vital signs reviewed and stable  Post vital signs: Reviewed and stable  Last Vitals:  Vitals:   05/17/16 1814 05/17/16 2003  BP: (!) 167/106 (!) 141/91  Pulse: 100 93  Resp: 20 16  Temp:  36.8 C    Last Pain:  Vitals:   05/17/16 2003  TempSrc: Temporal  PainSc:          Complications: No apparent anesthesia complications

## 2016-05-18 NOTE — Anesthesia Postprocedure Evaluation (Signed)
Anesthesia Post Note  Patient: Edison NasutiLori A Streat  Procedure(s) Performed: Procedure(s) (LRB): ESOPHAGOGASTRODUODENOSCOPY (EGD) WITH PROPOFOL (N/A)  Patient location during evaluation: Endoscopy Anesthesia Type: General Level of consciousness: awake and alert Pain management: pain level controlled Vital Signs Assessment: post-procedure vital signs reviewed and stable Respiratory status: spontaneous breathing, nonlabored ventilation, respiratory function stable and patient connected to nasal cannula oxygen Cardiovascular status: blood pressure returned to baseline and stable Postop Assessment: no signs of nausea or vomiting Anesthetic complications: no    Last Vitals:  Vitals:   05/17/16 2100 05/17/16 2105  BP: (!) 160/90   Pulse: 86 87  Resp:    Temp:      Last Pain:  Vitals:   05/17/16 2003  TempSrc: Temporal  PainSc:                  Mikiya Nebergall S

## 2016-12-20 IMAGING — CR DG CHEST 2V
2 series · 2 of 2 positions shown · non-contrast
Comparison: None.

CLINICAL DATA: Productive cough. Fever. Chest tightness. Wheezing
and shortness of breath for 4 weeks.

EXAM:
CHEST  2 VIEW

[chest pa]
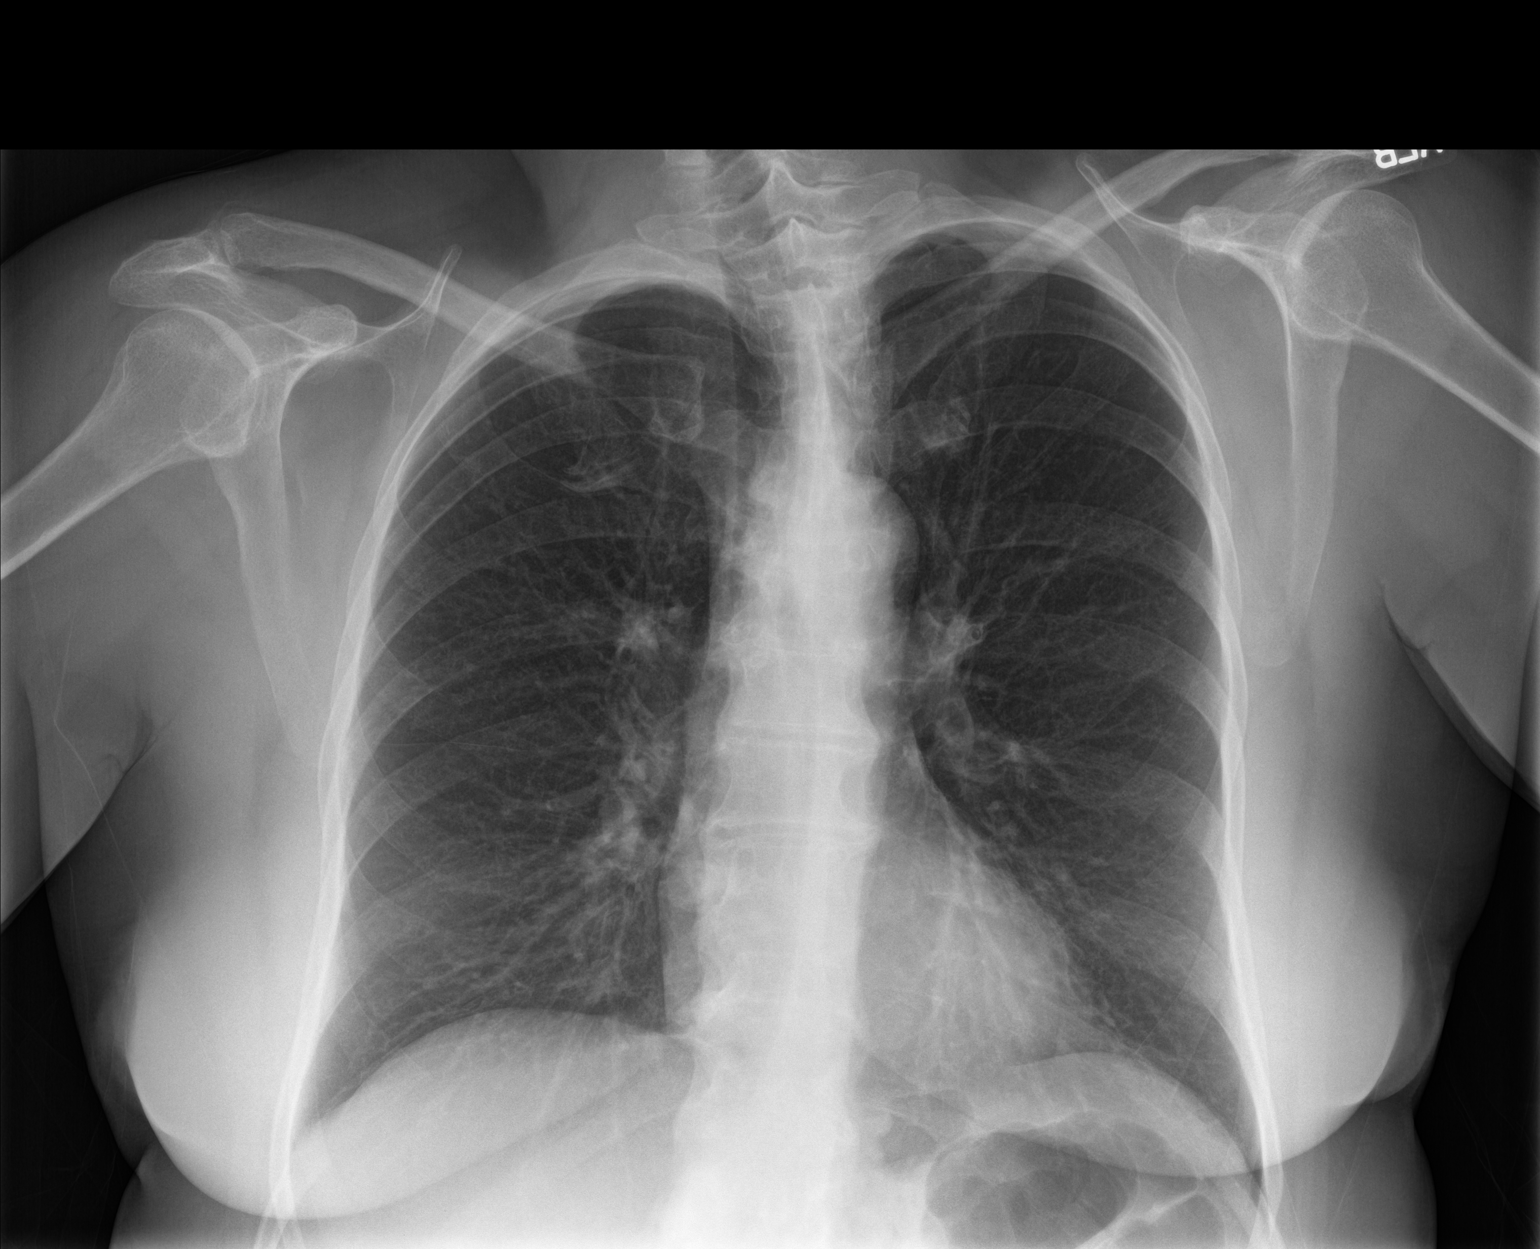

[chest lat]
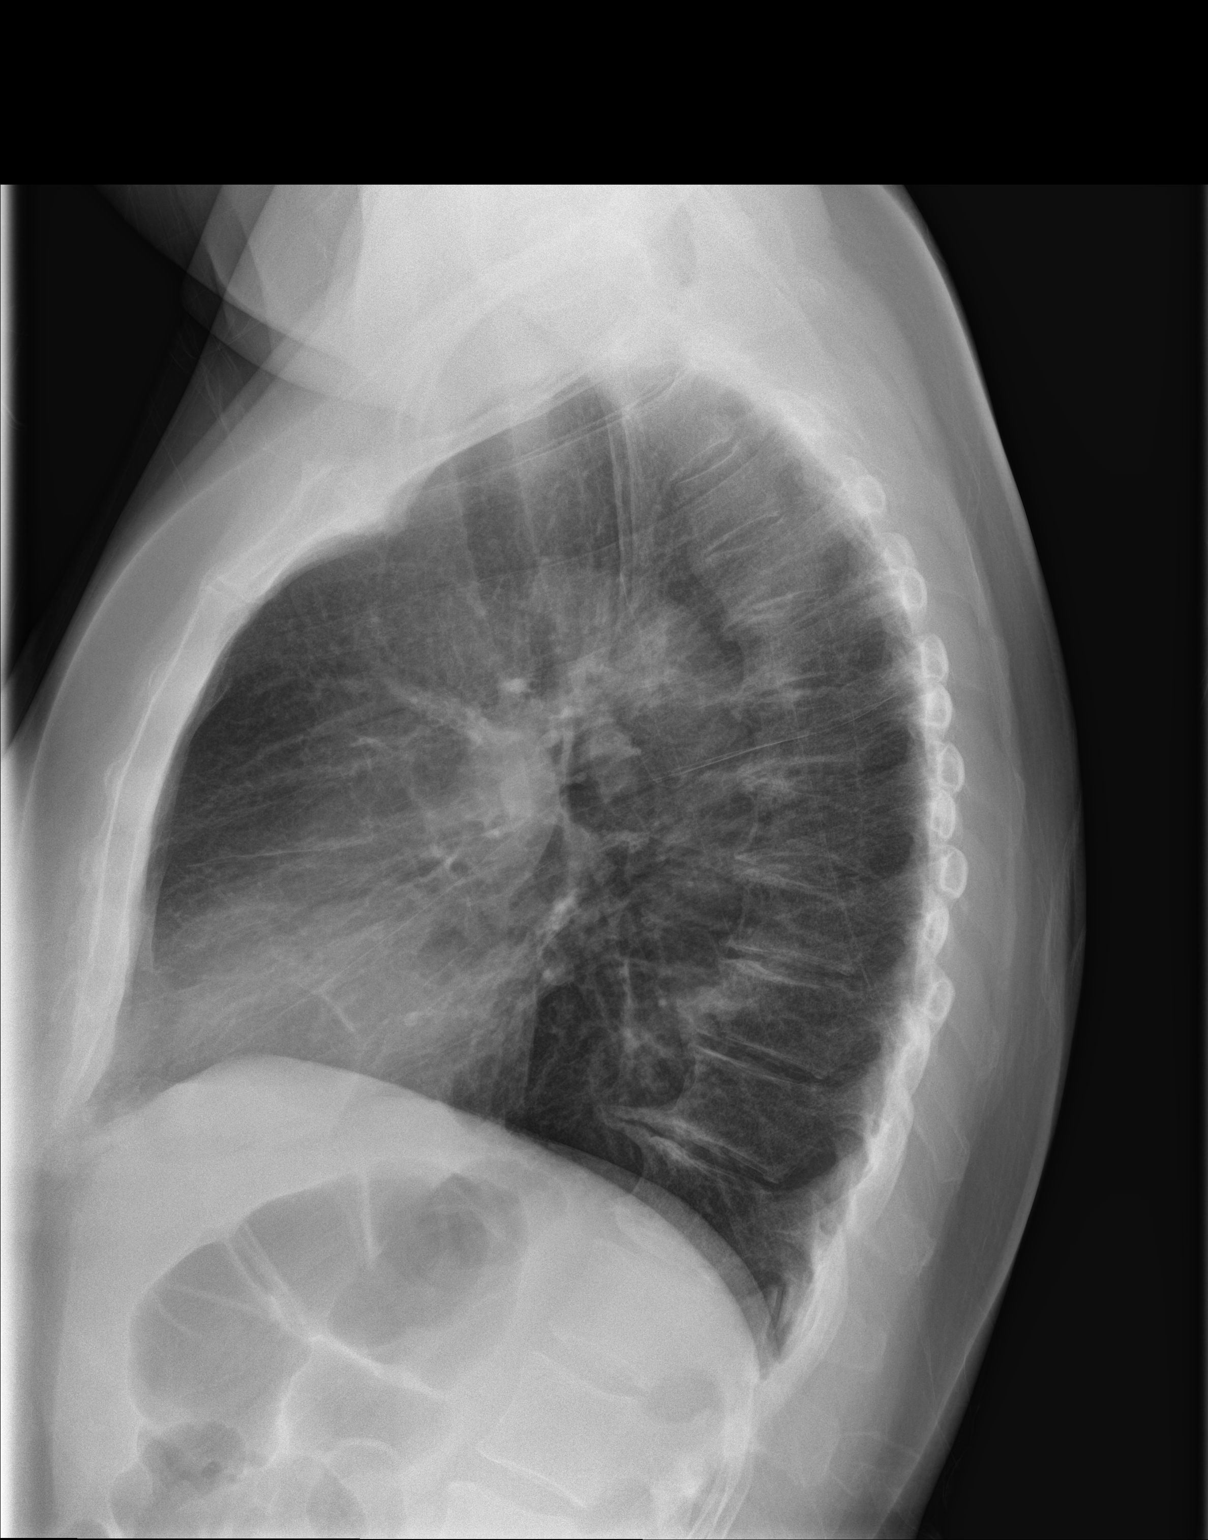

[2 of 2 positions shown; findings below may reference images not displayed]

FINDINGS: The heart size and mediastinal contours are within normal limits.
Both lungs are clear. No evidence of pneumothorax or pleural
effusion. Thoracic spine degenerative changes noted.
IMPRESSION: No active cardiopulmonary disease.

## 2017-02-19 ENCOUNTER — Emergency Department
Admission: EM | Admit: 2017-02-19 | Discharge: 2017-02-19 | Disposition: A | Discharge status: Home / Self Care | Payer: No Typology Code available for payment source | Attending: Emergency Medicine | Admitting: Emergency Medicine

## 2017-02-19 ENCOUNTER — Emergency Department: Payer: No Typology Code available for payment source

## 2017-02-19 ENCOUNTER — Encounter: Payer: Self-pay | Admitting: Emergency Medicine

## 2017-02-19 DIAGNOSIS — Y939 Activity, unspecified: Secondary | ICD-10-CM | POA: Diagnosis not present

## 2017-02-19 DIAGNOSIS — S161XXA Strain of muscle, fascia and tendon at neck level, initial encounter: Secondary | ICD-10-CM | POA: Diagnosis not present

## 2017-02-19 DIAGNOSIS — Y92410 Unspecified street and highway as the place of occurrence of the external cause: Secondary | ICD-10-CM | POA: Insufficient documentation

## 2017-02-19 DIAGNOSIS — Y999 Unspecified external cause status: Secondary | ICD-10-CM | POA: Diagnosis not present

## 2017-02-19 DIAGNOSIS — S39012A Strain of muscle, fascia and tendon of lower back, initial encounter: Secondary | ICD-10-CM | POA: Insufficient documentation

## 2017-02-19 DIAGNOSIS — S199XXA Unspecified injury of neck, initial encounter: Secondary | ICD-10-CM | POA: Diagnosis present

## 2017-02-19 MED ORDER — KETOROLAC TROMETHAMINE 30 MG/ML IJ SOLN
60.0000 mg | Freq: Once | INTRAMUSCULAR | Status: AC
  Administered 2017-02-19: 60 mg via INTRAMUSCULAR
  Filled 2017-02-19: qty 2

## 2017-02-19 MED ORDER — CYCLOBENZAPRINE HCL 10 MG PO TABS
5.0000 mg | ORAL_TABLET | Freq: Once | ORAL | Status: AC
  Administered 2017-02-19: 5 mg via ORAL
  Filled 2017-02-19: qty 1

## 2017-02-19 MED ORDER — CYCLOBENZAPRINE HCL 5 MG PO TABS: 5.0000 mg | ORAL_TABLET | Freq: Three times a day (TID) | ORAL | 0 refills | Status: DC | PRN

## 2017-02-19 MED ORDER — KETOROLAC TROMETHAMINE 60 MG/2ML IM SOLN
INTRAMUSCULAR | Status: AC
  Filled 2017-02-19: qty 2

## 2017-02-19 MED ORDER — KETOROLAC TROMETHAMINE 10 MG PO TABS: 10.0000 mg | ORAL_TABLET | Freq: Four times a day (QID) | ORAL | 0 refills | Status: AC | PRN
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
## 2017-02-19 NOTE — ED Notes (Signed)
Patient [facility]/o head/back/[facility] pain [facility]

## 2017-02-19 NOTE — Discharge Instructions (Signed)
Symptoms do not resolve follow-up with orthopedics. The contact information is attached. If symptoms acutely worsen do not hesitate to return to emergency department.

## 2017-04-18 IMAGING — CR DG CHEST 2V
2 series · 2 of 2 positions shown · non-contrast
Comparison: 07/31/2015

CLINICAL DATA: Cough, chest congestion, wheezing for 3 weeks

EXAM:
CHEST  2 VIEW

[chest pa]
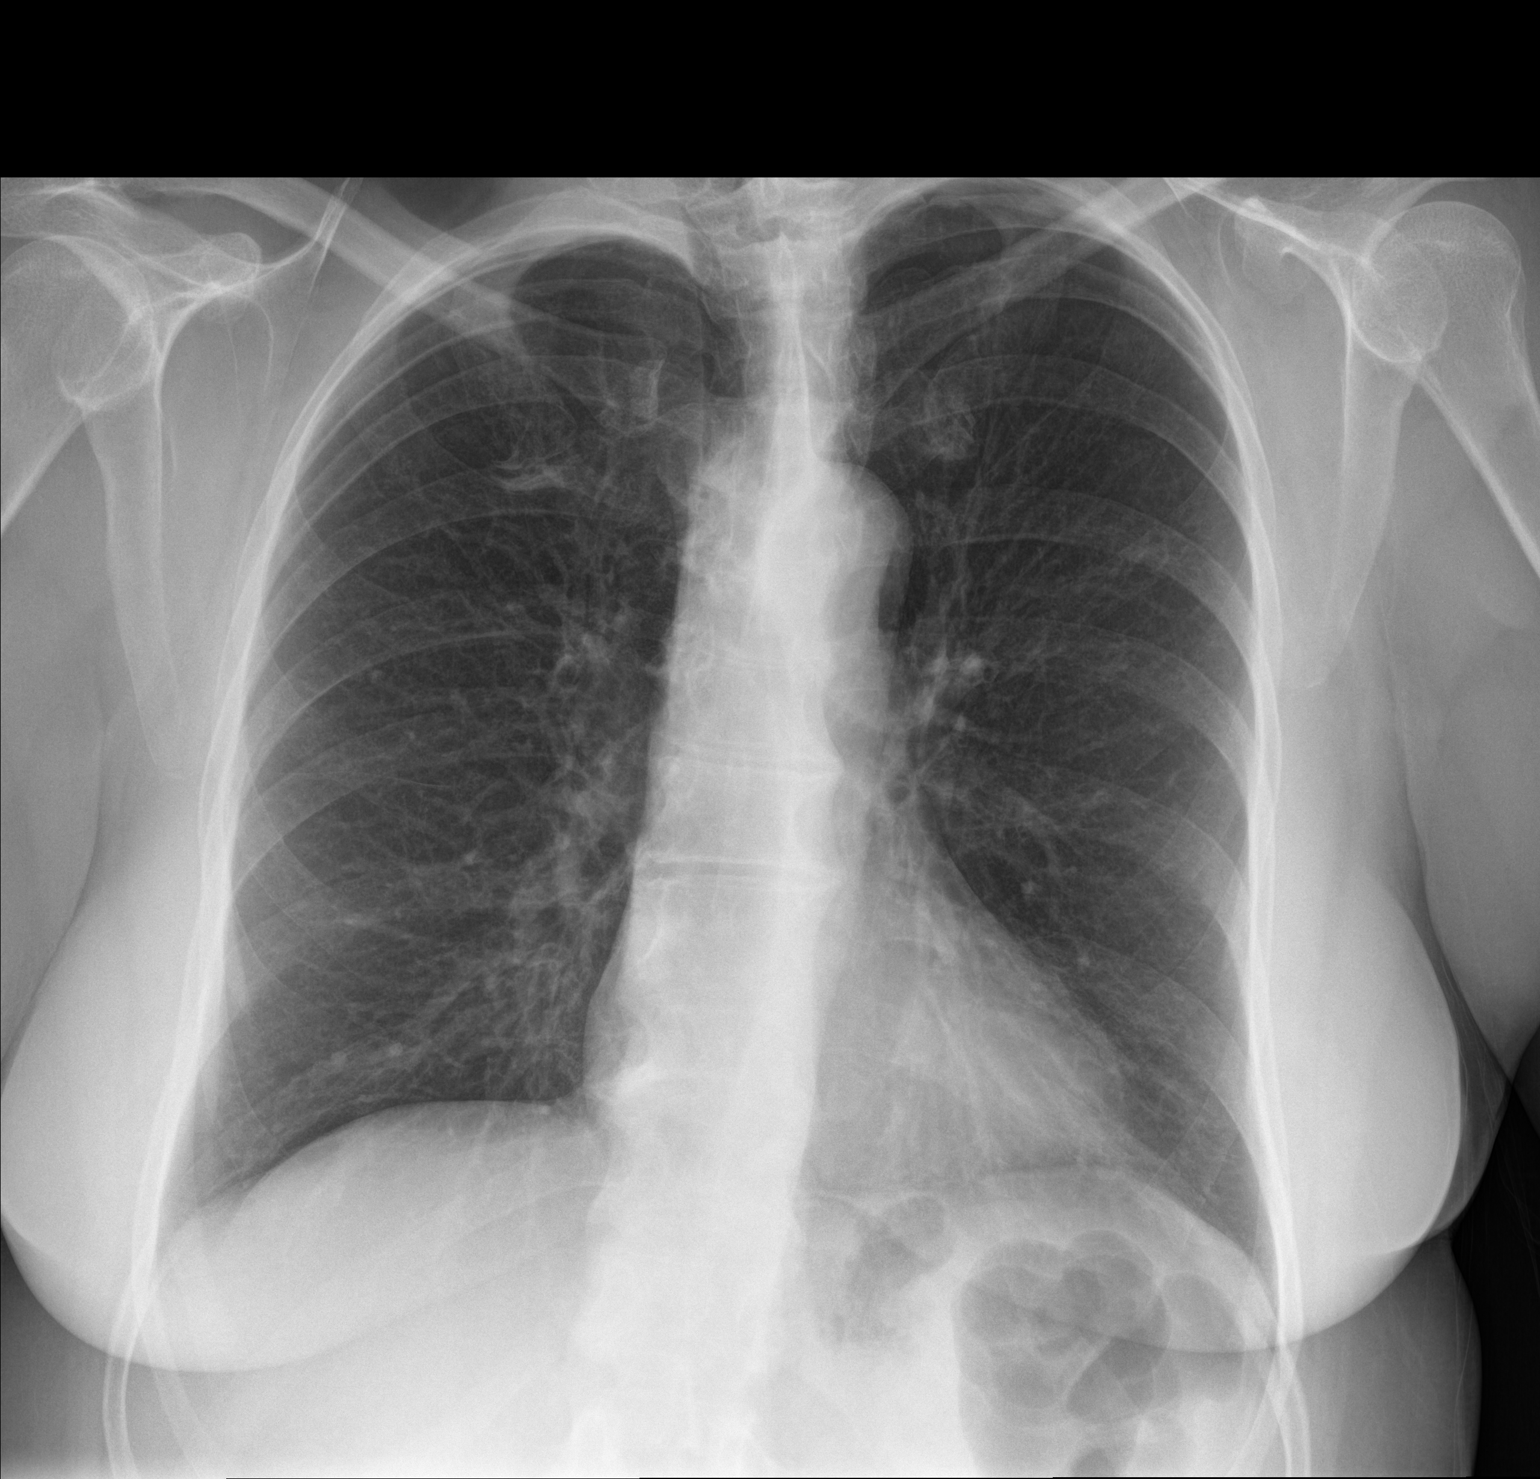

[chest lat]
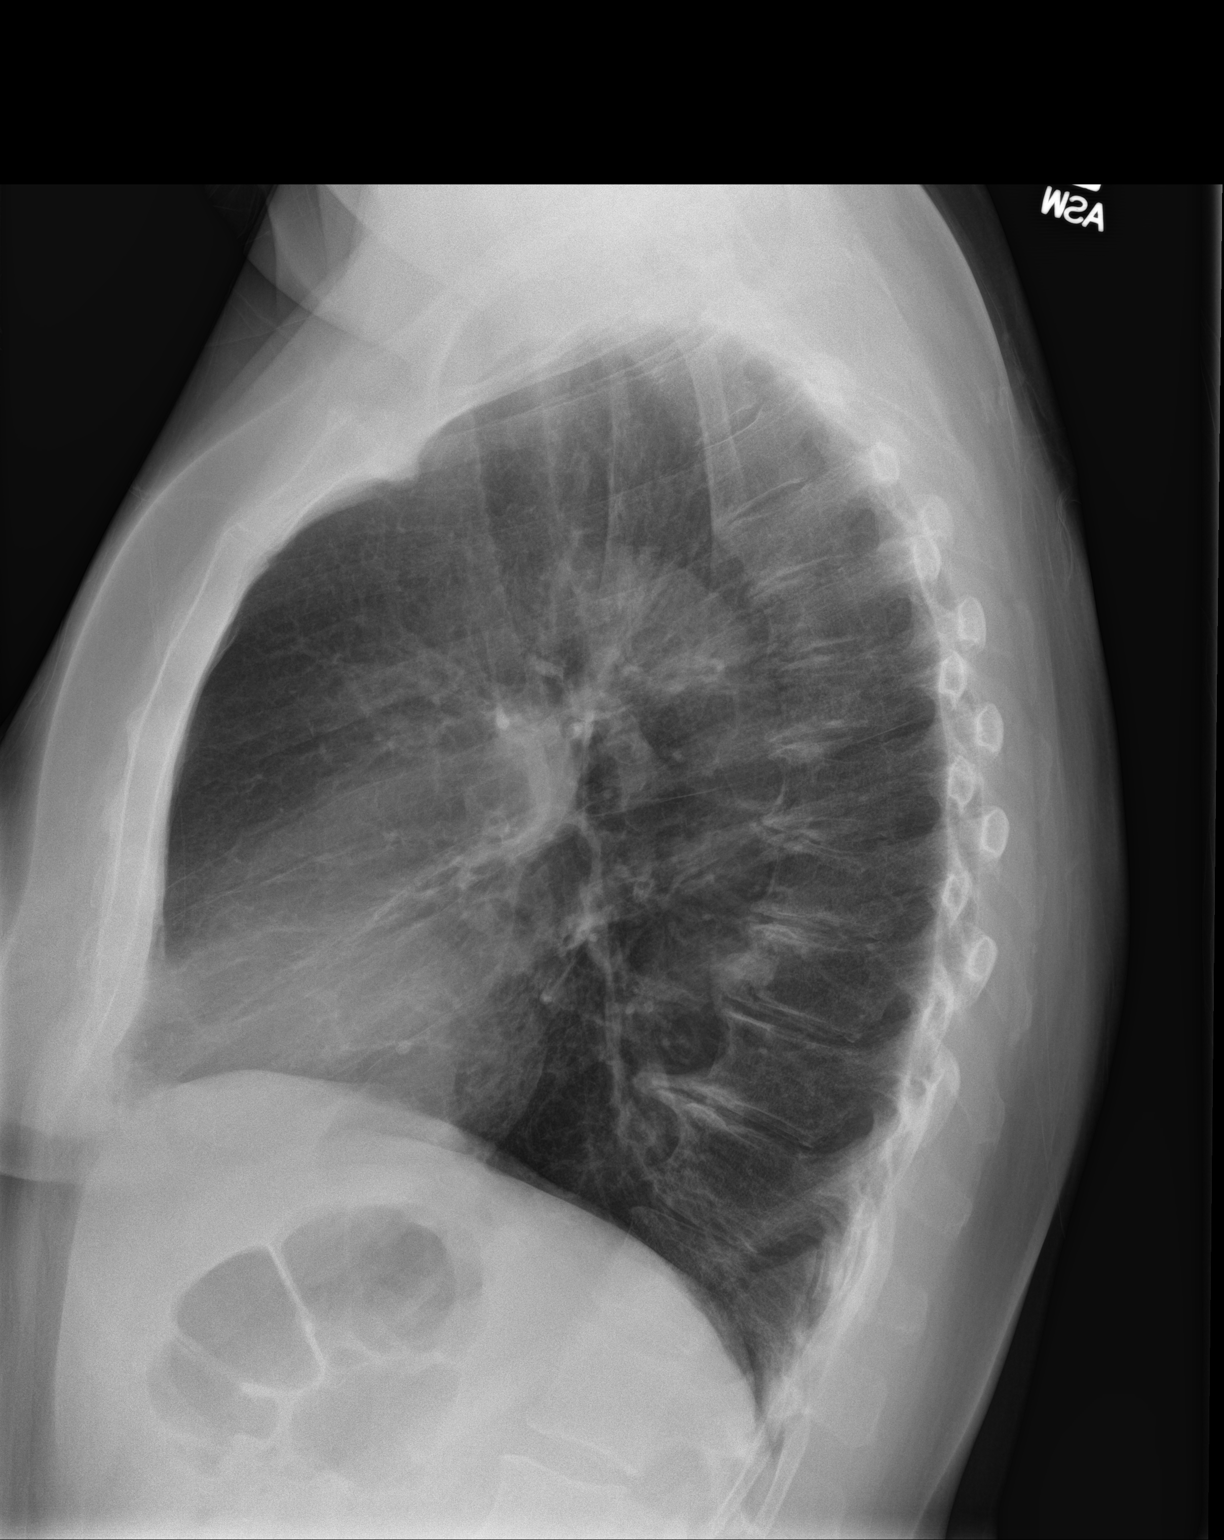

[2 of 2 positions shown; findings below may reference images not displayed]

FINDINGS: Cardiomediastinal silhouette is stable. No acute infiltrate or
pleural effusion. No pulmonary edema. Osteopenia and mild
degenerative changes thoracic spine.
IMPRESSION: No active cardiopulmonary disease. Osteopenia and mild degenerative
changes thoracic spine.
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
## 2020-06-13 ENCOUNTER — Emergency Department: Payer: BLUE CROSS/BLUE SHIELD | Admitting: Anesthesiology

## 2020-06-13 ENCOUNTER — Encounter
Admission: EM | Disposition: A | Discharge status: Home / Self Care | Payer: Self-pay | Source: Home / Self Care | Attending: Emergency Medicine

## 2020-06-13 ENCOUNTER — Emergency Department: Payer: BLUE CROSS/BLUE SHIELD

## 2020-06-13 ENCOUNTER — Other Ambulatory Visit: Payer: Self-pay

## 2020-06-13 ENCOUNTER — Ambulatory Visit
Admission: EM | Admit: 2020-06-13 | Discharge: 2020-06-14 | Disposition: A | Discharge status: Home / Self Care | Payer: BLUE CROSS/BLUE SHIELD | Attending: Gastroenterology | Admitting: Gastroenterology

## 2020-06-13 ENCOUNTER — Ambulatory Visit: Admit: 2020-06-13 | Payer: BLUE CROSS/BLUE SHIELD

## 2020-06-13 DIAGNOSIS — Z880 Allergy status to penicillin: Secondary | ICD-10-CM | POA: Diagnosis not present

## 2020-06-13 DIAGNOSIS — T18128A Food in esophagus causing other injury, initial encounter: Secondary | ICD-10-CM | POA: Insufficient documentation

## 2020-06-13 DIAGNOSIS — Z7951 Long term (current) use of inhaled steroids: Secondary | ICD-10-CM | POA: Diagnosis not present

## 2020-06-13 DIAGNOSIS — Z20822 Contact with and (suspected) exposure to covid-19: Secondary | ICD-10-CM | POA: Insufficient documentation

## 2020-06-13 DIAGNOSIS — Z79899 Other long term (current) drug therapy: Secondary | ICD-10-CM | POA: Insufficient documentation

## 2020-06-13 DIAGNOSIS — K209 Esophagitis, unspecified without bleeding: Secondary | ICD-10-CM | POA: Insufficient documentation

## 2020-06-13 DIAGNOSIS — X58XXXA Exposure to other specified factors, initial encounter: Secondary | ICD-10-CM | POA: Insufficient documentation

## 2020-06-13 DIAGNOSIS — K222 Esophageal obstruction: Secondary | ICD-10-CM | POA: Insufficient documentation

## 2020-06-13 HISTORY — PX: ESOPHAGOGASTRODUODENOSCOPY (EGD) WITH PROPOFOL: SHX5813

## 2020-06-13 LAB — RESP PANEL BY RT-PCR (FLU A&B, COVID) ARPGX2
Influenza A by PCR: NEGATIVE
Influenza B by PCR: NEGATIVE
SARS Coronavirus 2 by RT PCR: NEGATIVE

## 2020-06-13 SURGERY — ESOPHAGOGASTRODUODENOSCOPY (EGD) WITH PROPOFOL
Anesthesia: General

## 2020-06-13 MED ORDER — ONDANSETRON HCL 4 MG/2ML IJ SOLN
4.0000 mg | Freq: Once | INTRAMUSCULAR | Status: AC
  Administered 2020-06-13: 4 mg via INTRAVENOUS
  Filled 2020-06-13: qty 2

## 2020-06-13 MED ORDER — FENTANYL CITRATE (PF) 100 MCG/2ML IJ SOLN
INTRAMUSCULAR | Status: AC
  Filled 2020-06-13: qty 2

## 2020-06-13 MED ORDER — ONDANSETRON HCL 4 MG/2ML IJ SOLN
INTRAMUSCULAR | Status: AC
  Filled 2020-06-13: qty 2

## 2020-06-13 MED ORDER — SODIUM CHLORIDE 0.9 % IV SOLN: INTRAVENOUS | Status: DC

## 2020-06-13 MED ORDER — FENTANYL CITRATE (PF) 100 MCG/2ML IJ SOLN
INTRAMUSCULAR | Status: DC | PRN
  Administered 2020-06-13 (×2): 50 ug via INTRAVENOUS

## 2020-06-13 MED ORDER — GLUCAGON HCL RDNA (DIAGNOSTIC) 1 MG IJ SOLR
1.0000 mg | Freq: Once | INTRAMUSCULAR | Status: AC
  Administered 2020-06-13: 1 mg via INTRAVENOUS
  Filled 2020-06-13: qty 1

## 2020-06-13 MED ORDER — ONDANSETRON HCL 4 MG/2ML IJ SOLN
INTRAMUSCULAR | Status: DC | PRN
Start: 2020-06-13 — End: 2020-06-13
  Administered 2020-06-13: 4 mg via INTRAVENOUS

## 2020-06-13 MED ORDER — FAMOTIDINE IN NACL 20-0.9 MG/50ML-% IV SOLN
20.0000 mg | Freq: Once | INTRAVENOUS | Status: AC
Start: 2020-06-13 — End: 2020-06-13
  Administered 2020-06-13: 20 mg via INTRAVENOUS
  Filled 2020-06-13: qty 50

## 2020-06-13 MED ORDER — LIDOCAINE HCL (CARDIAC) PF 100 MG/5ML IV SOSY
PREFILLED_SYRINGE | INTRAVENOUS | Status: DC | PRN
  Administered 2020-06-13: 100 mg via INTRAVENOUS

## 2020-06-13 MED ORDER — ROCURONIUM BROMIDE 100 MG/10ML IV SOLN
INTRAVENOUS | Status: DC | PRN
  Administered 2020-06-13: 5 mg via INTRAVENOUS

## 2020-06-13 MED ORDER — FENTANYL CITRATE (PF) 100 MCG/2ML IJ SOLN: 25.0000 ug | INTRAMUSCULAR | Status: DC | PRN

## 2020-06-13 MED ORDER — DEXAMETHASONE SODIUM PHOSPHATE 10 MG/ML IJ SOLN
INTRAMUSCULAR | Status: DC | PRN
  Administered 2020-06-13: 10 mg via INTRAVENOUS

## 2020-06-13 MED ORDER — PROPOFOL 10 MG/ML IV BOLUS
INTRAVENOUS | Status: AC
  Filled 2020-06-13: qty 20

## 2020-06-13 MED ORDER — SUCCINYLCHOLINE CHLORIDE 20 MG/ML IJ SOLN
INTRAMUSCULAR | Status: DC | PRN
  Administered 2020-06-13: 160 mg via INTRAVENOUS

## 2020-06-13 MED ORDER — DEXAMETHASONE SODIUM PHOSPHATE 10 MG/ML IJ SOLN
INTRAMUSCULAR | Status: AC
  Filled 2020-06-13: qty 1

## 2020-06-13 MED ORDER — LACTATED RINGERS IV SOLN: INTRAVENOUS | Status: DC

## 2020-06-13 MED ORDER — PROPOFOL 10 MG/ML IV BOLUS
INTRAVENOUS | Status: DC | PRN
  Administered 2020-06-13: 200 mg via INTRAVENOUS

## 2020-06-13 MED ORDER — ONDANSETRON HCL 4 MG/2ML IJ SOLN: 4.0000 mg | Freq: Once | INTRAMUSCULAR | Status: DC | PRN

## 2020-06-13 NOTE — Anesthesia Postprocedure Evaluation (Signed)
Anesthesia Post Note  Patient: Kiara Perez  Procedure(s) Performed: ESOPHAGOGASTRODUODENOSCOPY (EGD) WITH PROPOFOL (N/A )  Patient location during evaluation: PACU Anesthesia Type: General Level of consciousness: awake and alert Pain management: pain level controlled Vital Signs Assessment: post-procedure vital signs reviewed and stable Respiratory status: spontaneous breathing and respiratory function stable Cardiovascular status: stable Anesthetic complications: no   No complications documented.   Last Vitals:  Vitals:   06/13/20 2315 06/13/20 2320  BP: 140/76 (!) 135/92  Pulse: 83 81  Resp: 20 13  Temp:    SpO2: 100% 99%    Last Pain:  Vitals:   06/13/20 2303  TempSrc:   PainSc: 0-No pain                 Yuniel Blaney K

## 2020-06-13 NOTE — Anesthesia Procedure Notes (Signed)
Procedure Name: Intubation Date/Time: 06/13/2020 10:44 PM Performed by: Irving Burton, CRNA Pre-anesthesia Checklist: Patient identified, Emergency Drugs available, Suction available and Patient being monitored Patient Re-evaluated:Patient Re-evaluated prior to induction Oxygen Delivery Method: Circle system utilized Preoxygenation: Pre-oxygenation with 100% oxygen Induction Type: IV induction, Rapid sequence and Cricoid Pressure applied Laryngoscope Size: McGraph and 3 Grade View: Grade II Tube type: Oral Tube size: 7.0 mm Number of attempts: 1 Airway Equipment and Method: Stylet and Video-laryngoscopy Placement Confirmation: ETT inserted through vocal cords under direct vision,  positive ETCO2 and breath sounds checked- equal and bilateral Secured at: 21 cm Tube secured with: Tape Dental Injury: Teeth and Oropharynx as per pre-operative assessment  Difficulty Due To: Difficulty was anticipated and Difficult Airway- due to anterior larynx

## 2020-06-13 NOTE — ED Triage Notes (Addendum)
Pt here via [facility] from home.   Pt was eating ham when it got stuck in her throat, history of the same which required surgery. Pt able to breathe without distress, pt coughing and gagging at this time. Pt reports it feels like it is "stuck in her chest". Pt attempted to drink soda, water, and mustard but was unable to keep anything down.

## 2020-06-13 NOTE — ED Notes (Signed)
ED Provider at bedside. 

## 2020-06-13 NOTE — ED Provider Notes (Signed)
[facility] Emergency Department Provider Note ____________________________________________   First MD Initiated Contact with Patient 06/13/20 1829     (approximate)  I have reviewed the triage vital signs and the nursing notes.   HISTORY  Chief Complaint Other (Foreign body)    HPI Kiara Perez is a 59 y.o. female with PMH as noted below who presents with concern for ingested foreign body.  The patient states that she was eating some ham off of a cutting board and apparently ate a piece that was slightly too big.  She states that after she swallowed it, she started to feel some pressure in her lower chest, nausea, and then has been unable to hold any solids or liquids down.  She states she tried to drink soda without relief.  She then started to feel short of breath, and decided to come to the hospital.  She states this happened once before around 10 years ago.  History reviewed. No pertinent past medical history.  Patient Active Problem List   Diagnosis Date Noted  . Foreign body in esophagus     History reviewed. No pertinent surgical history.  Prior to Admission medications   Medication Sig Start Date End Date Taking? Authorizing Provider  albuterol (PROVENTIL HFA;VENTOLIN HFA) 108 (90 Base) MCG/ACT inhaler Inhale 1-2 puffs into the lungs every 6 (six) hours as needed for wheezing or shortness of breath. 11/27/15   Jolene Provost, MD  cyclobenzaprine (FLEXERIL) 5 MG tablet Take 1 tablet (5 mg total) by mouth 3 (three) times daily as needed. 02/19/17   Little, Traci M, PA-C  ipratropium (ATROVENT) 0.06 % nasal spray Place 2 sprays into both nostrils 4 (four) times daily. 3-4 times/ day 02/28/16   Domenick Gong, MD  predniSONE (DELTASONE) 50 MG tablet 1 tab po on day 1, one tab po on day 2, 1/2 tab po on days 3 and 4 02/28/16   Domenick Gong, MD    Allergies Penicillins  No family history on file.  Social History Social History   Tobacco  Use  . Smoking status: Never Smoker  . Smokeless tobacco: Never Used  Substance Use Topics  . Alcohol use: No  . Drug use: No    Review of Systems  Constitutional: No fever. Eyes: No visual changes. ENT: No sore throat. Cardiovascular: Positive for lower chest discomfort. Respiratory: Positive for resolved shortness of breath. Gastrointestinal: Positive for nausea. Genitourinary: Negative for flank pain. Musculoskeletal: Negative for back pain. Skin: Negative for rash. Neurological: Negative for headache.   ____________________________________________   PHYSICAL EXAM:  VITAL SIGNS: ED Triage Vitals  Enc Vitals Group     BP 06/13/20 1834 (!) 188/106     Pulse Rate 06/13/20 1834 82     Resp 06/13/20 1834 18     Temp 06/13/20 1834 98.6 F (37 C)     Temp Source 06/13/20 1834 Oral     SpO2 06/13/20 1834 100 %     Weight 06/13/20 1830 167 lb (75.8 kg)     Height 06/13/20 1830 5\' 5"  (1.651 m)     Head Circumference --      Peak Flow --      Pain Score 06/13/20 1830 9     Pain Loc --      Pain Edu? --      Excl. in GC? --     Constitutional: Alert and oriented. Well appearing and in no acute distress. Eyes: Conjunctivae are normal.  Head: Atraumatic. Nose: No  congestion/rhinnorhea. Mouth/Throat: Mucous membranes are moist.  Oropharynx clear. Neck: Normal range of motion.  Cardiovascular: Normal rate, regular rhythm. Grossly normal heart sounds.  Good peripheral circulation. Respiratory: Normal respiratory effort.  No retractions. Lungs CTAB. Gastrointestinal: No distention.  Musculoskeletal: Extremities warm and well perfused.  Neurologic:  Normal speech and language. No gross focal neurologic deficits are appreciated.  Skin:  Skin is warm and dry. No rash noted. Psychiatric: Mood and affect are normal. Speech and behavior are normal.  ____________________________________________   LABS (all labs ordered are listed, but only abnormal results are  displayed)  Labs Reviewed  RESP PANEL BY RT-PCR (FLU A&B, COVID) [id number]   ____________________________________________  EKG   ____________________________________________  RADIOLOGY  CXR interpreted by me shows no focal infiltrate or edema  ____________________________________________   PROCEDURES  Procedure(s) performed: No  Procedures  Critical Care performed: No ____________________________________________   INITIAL IMPRESSION / ASSESSMENT AND PLAN / ED COURSE  Pertinent labs & imaging results that were available during my care of the patient were reviewed by me and considered in my medical decision making (see chart for details).  59 year old female with PMH as noted above presents with concern for possible esophageal food impaction.  The patient states that she ate a piece of ham and subsequently has become unable to hold any solids or liquids down.  This happened to her once previously.  I reviewed the past medical records in Epic.  The patient was seen in the ED in 2017 with esophageal food impaction after eating steak.  She required endoscopic removal.  She states this is the only time this happened to her.  On exam, the patient is overall well-appearing.  Her vital signs are normal except for hypertension.  The physical exam is unremarkable.  She is still unable to tolerate liquids or solids and is spitting up saliva.  Presentation is consistent with esophageal food impaction.  Differential includes esophagitis or spasm.  We will obtain a chest x-ray, give glucagon and Zofran and reassess.  ----------------------------------------- 8:14 PM on 06/13/2020 -----------------------------------------  The patient has had no improvement with glucagon and Zofran.  I gave her some ginger ale but she regurgitated it within 15 to 20 seconds.  I consulted Dr. Mia Creek from GI for evaluation.  ____________________________________________   FINAL CLINICAL IMPRESSION(S) /  ED DIAGNOSES  Final diagnoses:  Esophageal obstruction due to food impaction      NEW MEDICATIONS STARTED DURING THIS VISIT:  New Prescriptions   No medications on file     Note:  This document was prepared using Dragon voice recognition software and may include unintentional dictation errors.   Dionne Bucy, MD 06/13/20 2124

## 2020-06-13 NOTE — Anesthesia Preprocedure Evaluation (Signed)
Anesthesia Evaluation  Patient identified by MRN, date of birth, ID band Patient awake    Reviewed: Allergy & Precautions, NPO status , Patient's Chart, lab work & pertinent test results  History of Anesthesia Complications Negative for: history of anesthetic complications  Airway Mallampati: III       Dental   Pulmonary neg sleep apnea, neg COPD, Not current smoker,           Cardiovascular (-) hypertension(-) Past MI and (-) CHF (-) dysrhythmias (-) Valvular Problems/Murmurs     Neuro/Psych neg Seizures    GI/Hepatic Neg liver ROS,   Endo/Other  neg diabetes  Renal/GU negative Renal ROS     Musculoskeletal   Abdominal   Peds  Hematology   Anesthesia Other Findings   Reproductive/Obstetrics                             Anesthesia Physical Anesthesia Plan  ASA: I and emergent  Anesthesia Plan: General   Post-op Pain Management:    Induction: Intravenous, Rapid sequence and Cricoid pressure planned  PONV Risk Score and Plan: 3 and Ondansetron and Dexamethasone  Airway Management Planned: Oral ETT  Additional Equipment:   Intra-op Plan:   Post-operative Plan:   Informed Consent: I have reviewed the patients History and Physical, chart, labs and discussed the procedure including the risks, benefits and alternatives for the proposed anesthesia with the patient or authorized representative who has indicated his/her understanding and acceptance.       Plan Discussed with:   Anesthesia Plan Comments:         Anesthesia Quick Evaluation

## 2020-06-13 NOTE — ED Notes (Signed)
Report given to OR RN. Pt in NAD at this time. VSS.  

## 2020-06-13 NOTE — Consult Note (Signed)
Consultation  Referring Provider:  ED    Admit date: 10/25 Consult date: 10/25         Reason for Consultation:     Food Bolus         HPI:   Kiara Perez is a 59 y.o. lady with no significant past medical history besides past history of food bolus in 2017 here with concerns of recurrent food bolus after eating ham. Denies any follow-up after initial episode in 2017. No blood thinners.  History reviewed. No pertinent past medical history.  History reviewed. No pertinent surgical history.  No family history on file.   Social History   Tobacco Use  . Smoking status: Never Smoker  . Smokeless tobacco: Never Used  Substance Use Topics  . Alcohol use: No  . Drug use: No    Prior to Admission medications   Medication Sig Start Date End Date Taking? Authorizing Provider  albuterol (PROVENTIL HFA;VENTOLIN HFA) 108 (90 Base) MCG/ACT inhaler Inhale 1-2 puffs into the lungs every 6 (six) hours as needed for wheezing or shortness of breath. 11/27/15   Jolene Provost, MD  cyclobenzaprine (FLEXERIL) 5 MG tablet Take 1 tablet (5 mg total) by mouth 3 (three) times daily as needed. 02/19/17   Little, Traci M, PA-C  ipratropium (ATROVENT) 0.06 % nasal spray Place 2 sprays into both nostrils 4 (four) times daily. 3-4 times/ day 02/28/16   Domenick Gong, MD  predniSONE (DELTASONE) 50 MG tablet 1 tab po on day 1, one tab po on day 2, 1/2 tab po on days 3 and 4 02/28/16   Domenick Gong, MD    No current facility-administered medications for this encounter.   Current Outpatient Medications  Medication Sig Dispense Refill  . albuterol (PROVENTIL HFA;VENTOLIN HFA) 108 (90 Base) MCG/ACT inhaler Inhale 1-2 puffs into the lungs every 6 (six) hours as needed for wheezing or shortness of breath. 1 Inhaler 0  . cyclobenzaprine (FLEXERIL) 5 MG tablet Take 1 tablet (5 mg total) by mouth 3 (three) times daily as needed. 20 tablet 0  . ipratropium (ATROVENT) 0.06 % nasal spray Place 2 sprays into both  nostrils 4 (four) times daily. 3-4 times/ day 15 mL 0  . predniSONE (DELTASONE) 50 MG tablet 1 tab po on day 1, one tab po on day 2, 1/2 tab po on days 3 and 4 3 tablet 0    Allergies as of 06/13/2020 - Review Complete 06/13/2020  Allergen Reaction Noted  . Penicillins Rash 07/31/2015     Review of Systems:    All systems reviewed and negative except where noted in HPI.  Review of Systems  Constitutional: Negative for chills, fever and weight loss.  Respiratory: Positive for shortness of breath.   Cardiovascular: Negative for chest pain.  Gastrointestinal: Positive for heartburn, nausea and vomiting. Negative for abdominal pain, blood in stool, constipation, diarrhea and melena.  Musculoskeletal: Negative for joint pain.  Skin: Negative for rash.  Neurological: Negative for focal weakness.  Psychiatric/Behavioral: Negative for substance abuse.      Physical Exam:  Vital signs in last 24 hours: Temp:  [98.6 F (37 C)] 98.6 F (37 C) (11/25 1834) Pulse Rate:  [82-107] 107 (11/25 2000) Resp:  [16-18] 16 (11/25 2000) BP: (170-188)/(91-106) 171/97 (11/25 2000) SpO2:  [98 %-100 %] 98 % (11/25 2000) Weight:  [75.8 kg] 75.8 kg (11/25 1830)   General:   Pleasant in moderate distress Head:  Normocephalic and atraumatic. Eyes:   No icterus.  Conjunctiva pink. Ears:  Normal auditory acuity. Mouth: Mucosa pink moist, no lesions. Neck:  Supple; no masses felt Lungs:  No respiratory distress Heart:  RRR Abdomen:   Non-tender Rectal:  Not performed.  Msk:  MAEW x4, No clubbing or cyanosis. Neurologic:  No focal deficits Skin:  Warm, dry, pink without significant lesions or rashes. Psych:  Alert and cooperative. Normal affect.  LAB RESULTS: No results for input(s): WBC, HGB, HCT, PLT in the last 72 hours. BMET No results for input(s): NA, K, CL, CO2, GLUCOSE, BUN, CREATININE, CALCIUM in the last 72 hours. LFT No results for input(s): PROT, ALBUMIN, AST, ALT, ALKPHOS, BILITOT,  BILIDIR, IBILI in the last 72 hours. PT/INR No results for input(s): LABPROT, INR in the last 72 hours.  STUDIES: DG Chest Portable 1 View  Result Date: 06/13/2020 CLINICAL DATA:  Esophageal food impaction EXAM: PORTABLE CHEST 1 VIEW COMPARISON:  Chest x-ray 05/17/2016. FINDINGS: The heart size and mediastinal contours are within normal limits. No focal consolidation. No pulmonary edema. No pleural effusion. No pneumothorax. No acute osseous abnormality. Multilevel degenerative changes of the spine. IMPRESSION: No active disease. Electronically Signed   By: Tish Frederickson M.D.   On: 06/13/2020 19:03       Impression / Plan:   59 y/o lady with history of a food bolus here with another food bolus of ham. It was ingested at 4 pm.  - plan for EGD tonight - further recs after procedure  Merlyn Lot MD, MPH [facility]

## 2020-06-13 NOTE — Op Note (Signed)
[facility] Patient Name: Kiara Perez Procedure Date: 06/13/2020 9:13 PM MRN: [id number] Account #: [id number] Date of Birth: 03/25/1961 Admit Type: Emergency Department Age: 59 Room: [facility] ENDO ROOM 4 Gender: Female Note Status: Finalized Procedure:             Upper GI endoscopy Indications:           Foreign body in the esophagus Providers:             Eather Colas MD, MD Referring MD:          No Local Md, MD (Referring MD) Medicines:             Monitored Anesthesia Care Complications:         No immediate complications. Procedure:             Pre-Anesthesia Assessment:                        - Prior to the procedure, a History and Physical was                         performed, and patient medications and allergies were                         reviewed. The patient is competent. The risks and                         benefits of the procedure and the sedation options and                         risks were discussed with the patient. All questions                         were answered and informed consent was obtained.                         Patient identification and proposed procedure were                         verified by the physician, the nurse, the anesthetist                         and the technician in the endoscopy suite. Mental                         Status Examination: alert and oriented. Airway                         Examination: normal oropharyngeal airway and neck                         mobility. Respiratory Examination: clear to                         auscultation. CV Examination: normal. Prophylactic                         Antibiotics: The patient does not require prophylactic  antibiotics. Prior Anticoagulants: The patient has                         taken no previous anticoagulant or antiplatelet                         agents. ASA Grade Assessment: II - A patient with mild                          systemic disease. After reviewing the risks and                         benefits, the patient was deemed in satisfactory                         condition to undergo the procedure. The anesthesia                         plan was to use general anesthesia. Immediately prior                         to administration of medications, the patient was                         re-assessed for adequacy to receive sedatives. The                         heart rate, respiratory rate, oxygen saturations,                         blood pressure, adequacy of pulmonary ventilation, and                         response to care were monitored throughout the                         procedure. The physical status of the patient was                         re-assessed after the procedure.                        After obtaining informed consent, the endoscope was                         passed under direct vision. Throughout the procedure,                         the patient's blood pressure, pulse, and oxygen                         saturations were monitored continuously. The Endoscope                         was introduced through the mouth, and advanced to the                         second part of duodenum. The upper GI endoscopy was  accomplished without difficulty. The patient tolerated                         the procedure well. Findings:      Food was found in the lower third of the esophagus. Removal of food was       accomplished.      LA Grade B (one or more mucosal breaks greater than 5 mm, not extending       between the tops of two mucosal folds) esophagitis with no bleeding was       found.      A low-grade of narrowing Schatzki ring was found in the lower third of       the esophagus.      The entire examined stomach was normal.      The examined duodenum was normal. Impression:            - Food in the lower third of the esophagus. Removal                          was successful.                        - LA Grade B esophagitis with no bleeding.                        - Low-grade of narrowing Schatzki ring.                        - Normal stomach.                        - Normal examined duodenum. Recommendation:        - Discharge patient to home.                        - Resume previous diet.                        - Use Protonix (pantoprazole) 20 mg PO daily.                        - Repeat upper endoscopy in 4 weeks.                        - Return to GI clinic at appointment to be scheduled.                        - Chew food slowly and carefully Procedure Code(s):     --- Professional ---                        [phone], Esophagogastroduodenoscopy, flexible,                         transoral; with removal of foreign body(s) Diagnosis Code(s):     --- Professional ---                        Y10.175Z, Food in esophagus causing other injury,  initial encounter                        K20.90, Esophagitis, unspecified without bleeding                        K22.2, Esophageal obstruction                        T18.108A, Unspecified foreign body in esophagus                         causing other injury, initial encounter CPT copyright 2019 [employer]. All rights reserved. The codes documented in this report are preliminary and upon coder review may  be revised to meet current compliance requirements. Eather Colas, MD Eather Colas MD, MD 06/13/2020 11:01:13 PM Number of Addenda: 0 Note Initiated On: 06/13/2020 9:13 PM Estimated Blood Loss:  Estimated blood loss: none.      [facility]

## 2020-06-13 NOTE — Transfer of Care (Signed)
Immediate Anesthesia Transfer of Care Note  Patient: Kiara Perez  Procedure(s) Performed: ESOPHAGOGASTRODUODENOSCOPY (EGD) WITH PROPOFOL (N/A )  Patient Location: PACU  Anesthesia Type:General  Level of Consciousness: sedated  Airway & Oxygen Therapy: Patient connected to face mask oxygen  Post-op Assessment: Post -op Vital signs reviewed and stable  Post vital signs: stable  Last Vitals:  Vitals Value Taken Time  BP 140/76 06/13/20 2305  Temp    Pulse 82 06/13/20 2306  Resp 14 06/13/20 2306  SpO2 100 % 06/13/20 2306  Vitals shown include unvalidated device data.  Last Pain:  Vitals:   06/13/20 1834  TempSrc: Oral  PainSc:          Complications: No complications documented.

## 2020-06-13 NOTE — Progress Notes (Signed)
Patient given instructions per Endo RN , Lawson Fiscal, pt verbalzes understanding and so does pts daughter

## 2020-06-18 ENCOUNTER — Encounter: Payer: Self-pay | Admitting: Gastroenterology

## 2020-07-11 ENCOUNTER — Other Ambulatory Visit
Admission: RE | Admit: 2020-07-11 | Discharge: 2020-07-11 | Disposition: A | Discharge status: Home / Self Care | Payer: BLUE CROSS/BLUE SHIELD | Source: Ambulatory Visit | Attending: Gastroenterology | Admitting: Gastroenterology

## 2020-07-11 ENCOUNTER — Other Ambulatory Visit: Payer: Self-pay

## 2020-07-11 ENCOUNTER — Encounter: Payer: Self-pay | Admitting: *Deleted

## 2020-07-11 DIAGNOSIS — Z20822 Contact with and (suspected) exposure to covid-19: Secondary | ICD-10-CM | POA: Diagnosis not present

## 2020-07-11 DIAGNOSIS — Z01812 Encounter for preprocedural laboratory examination: Secondary | ICD-10-CM | POA: Insufficient documentation

## 2020-07-11 LAB — SARS CORONAVIRUS 2 (TAT 6-24 HRS): SARS Coronavirus 2: NEGATIVE

## 2020-07-15 ENCOUNTER — Ambulatory Visit: Payer: BLUE CROSS/BLUE SHIELD | Admitting: Certified Registered Nurse Anesthetist

## 2020-07-15 ENCOUNTER — Encounter: Payer: Self-pay | Admitting: *Deleted

## 2020-07-15 ENCOUNTER — Encounter
Admission: RE | Disposition: A | Discharge status: Home / Self Care | Payer: Self-pay | Source: Home / Self Care | Attending: Gastroenterology

## 2020-07-15 ENCOUNTER — Other Ambulatory Visit: Payer: Self-pay

## 2020-07-15 ENCOUNTER — Ambulatory Visit
Admission: RE | Admit: 2020-07-15 | Discharge: 2020-07-15 | Disposition: A | Discharge status: Home / Self Care | Payer: BLUE CROSS/BLUE SHIELD | Attending: Gastroenterology | Admitting: Gastroenterology

## 2020-07-15 DIAGNOSIS — K209 Esophagitis, unspecified without bleeding: Secondary | ICD-10-CM | POA: Insufficient documentation

## 2020-07-15 DIAGNOSIS — R1310 Dysphagia, unspecified: Secondary | ICD-10-CM | POA: Insufficient documentation

## 2020-07-15 DIAGNOSIS — Z79899 Other long term (current) drug therapy: Secondary | ICD-10-CM | POA: Diagnosis not present

## 2020-07-15 DIAGNOSIS — K222 Esophageal obstruction: Secondary | ICD-10-CM | POA: Diagnosis not present

## 2020-07-15 DIAGNOSIS — K449 Diaphragmatic hernia without obstruction or gangrene: Secondary | ICD-10-CM | POA: Diagnosis not present

## 2020-07-15 DIAGNOSIS — Z880 Allergy status to penicillin: Secondary | ICD-10-CM | POA: Insufficient documentation

## 2020-07-15 HISTORY — DX: Dysphagia, unspecified: R13.10

## 2020-07-15 HISTORY — PX: ESOPHAGOGASTRODUODENOSCOPY: SHX5428

## 2020-07-15 HISTORY — DX: Headache, unspecified: R51.9

## 2020-07-15 SURGERY — EGD (ESOPHAGOGASTRODUODENOSCOPY)
Anesthesia: General

## 2020-07-15 MED ORDER — SODIUM CHLORIDE 0.9 % IV SOLN: INTRAVENOUS | Status: DC | PRN

## 2020-07-15 MED ORDER — PROPOFOL 500 MG/50ML IV EMUL
INTRAVENOUS | Status: DC | PRN
  Administered 2020-07-15: 200 ug/kg/min via INTRAVENOUS

## 2020-07-15 MED ORDER — PROPOFOL 10 MG/ML IV BOLUS
INTRAVENOUS | Status: DC | PRN
  Administered 2020-07-15: 20 mg via INTRAVENOUS
  Administered 2020-07-15 (×2): 40 mg via INTRAVENOUS

## 2020-07-15 MED ORDER — PROPOFOL 500 MG/50ML IV EMUL
INTRAVENOUS | Status: AC
  Filled 2020-07-15: qty 50

## 2020-07-15 MED ORDER — LIDOCAINE HCL (CARDIAC) PF 100 MG/5ML IV SOSY
PREFILLED_SYRINGE | INTRAVENOUS | Status: DC | PRN
  Administered 2020-07-15: 40 mg via INTRAVENOUS

## 2020-07-15 MED ORDER — SODIUM CHLORIDE 0.9 % IV SOLN: INTRAVENOUS | Status: DC

## 2020-07-15 NOTE — Op Note (Signed)
[facility] Gastroenterology Patient Name: Kiara Perez Procedure Date: 07/15/2020 11:42 AM MRN: [id number] Account #: [id number] Date of Birth: 1960-11-08 Admit Type: Outpatient Age: 59 Room: [facility] ENDO ROOM 3 Gender: Female Note Status: Finalized Procedure:             Upper GI endoscopy Indications:           Dysphagia, Follow-up of esophagitis Providers:             Andrey Farmer MD, MD Referring MD:          Gayland Curry MD, MD (Referring MD) Medicines:             Monitored Anesthesia Care Complications:         No immediate complications. Estimated blood loss:                         Minimal. Procedure:             Pre-Anesthesia Assessment:                        - Prior to the procedure, a History and Physical was                         performed, and patient medications and allergies were                         reviewed. The patient is competent. The risks and                         benefits of the procedure and the sedation options and                         risks were discussed with the patient. All questions                         were answered and informed consent was obtained.                         Patient identification and proposed procedure were                         verified by the physician, the nurse, the anesthetist                         and the technician in the endoscopy suite. Mental                         Status Examination: alert and oriented. Airway                         Examination: normal oropharyngeal airway and neck                         mobility. Respiratory Examination: clear to                         auscultation. CV Examination: normal. Prophylactic  Antibiotics: The patient does not require prophylactic                         antibiotics. Prior Anticoagulants: The patient has                         taken no previous anticoagulant or antiplatelet                         agents.  ASA Grade Assessment: II - A patient with mild                         systemic disease. After reviewing the risks and                         benefits, the patient was deemed in satisfactory                         condition to undergo the procedure. The anesthesia                         plan was to use monitored anesthesia care (MAC).                         Immediately prior to administration of medications,                         the patient was re-assessed for adequacy to receive                         sedatives. The heart rate, respiratory rate, oxygen                         saturations, blood pressure, adequacy of pulmonary                         ventilation, and response to care were monitored                         throughout the procedure. The physical status of the                         patient was re-assessed after the procedure.                        After obtaining informed consent, the endoscope was                         passed under direct vision. Throughout the procedure,                         the patient's blood pressure, pulse, and oxygen                         saturations were monitored continuously. The Endoscope                         was introduced through the mouth, and advanced to the  second part of duodenum. The upper GI endoscopy was                         accomplished without difficulty. The patient tolerated                         the procedure well. Findings:      A mild Schatzki ring was found in the lower third of the esophagus. A       TTS dilator was passed through the scope. Dilation with a 15-16.5-18 mm       balloon dilator was performed to 18 mm. The dilation site was examined       and showed moderate mucosal disruption. Estimated blood loss was       minimal. Biopsies were obtained from the proximal and distal esophagus       with cold forceps for histology of suspected eosinophilic esophagitis.      A small  hiatal hernia was present.      The entire examined stomach was normal.      The examined duodenum was normal. Impression:            - Mild Schatzki ring. Dilated. Biopsied.                        - Small hiatal hernia.                        - Normal stomach.                        - Normal examined duodenum. Recommendation:        - Discharge patient to home.                        - Resume previous diet.                        - Continue present medications.                        - Await pathology results.                        - Return to referring physician as previously                         scheduled. Procedure Code(s):     --- Professional ---                        [phone], Esophagogastroduodenoscopy, flexible,                         transoral; with transendoscopic balloon dilation of                         esophagus (less than 30 mm diameter) Diagnosis Code(s):     --- Professional ---                        K22.2, Esophageal obstruction  K44.9, Diaphragmatic hernia without obstruction or                         gangrene                        R13.10, Dysphagia, unspecified                        K20.90, Esophagitis, unspecified without bleeding CPT copyright 2019 American Medical Association. All rights reserved. The codes documented in this report are preliminary and upon coder review may  be revised to meet current compliance requirements. Andrey Farmer, MD Andrey Farmer MD, MD 07/15/2020 12:15:03 PM Number of Addenda: 0 Note Initiated On: 07/15/2020 11:42 AM Estimated Blood Loss:  Estimated blood loss was minimal.      [employer]

## 2020-07-15 NOTE — Interval H&P Note (Signed)
History and Physical Interval Note:  07/15/2020 11:31 AM  Kiara Perez  has presented today for surgery, with the diagnosis of DYSPHAGIA.  The various methods of treatment have been discussed with the patient and family. After consideration of risks, benefits and other options for treatment, the patient has consented to  Procedure(s): ESOPHAGOGASTRODUODENOSCOPY (EGD) (N/A) as a surgical intervention.  The patient's history has been reviewed, patient examined, no change in status, stable for surgery.  I have reviewed the patient's chart and labs.  Questions were answered to the patient's satisfaction.     Regis Bill  Ok to proceed with EGD

## 2020-07-15 NOTE — Anesthesia Preprocedure Evaluation (Signed)
Anesthesia Evaluation  Patient identified by MRN, date of birth, ID band Patient awake    Reviewed: Allergy & Precautions, H&P , NPO status , Patient's Chart, lab work & pertinent test results, reviewed documented beta blocker date and time   Airway Mallampati: II   Neck ROM: full    Dental  (+) Poor Dentition   Pulmonary neg pulmonary ROS,    Pulmonary exam normal        Cardiovascular negative cardio ROS Normal cardiovascular exam Rhythm:regular Rate:Normal     Neuro/Psych  Headaches, negative psych ROS   GI/Hepatic negative GI ROS, Neg liver ROS,   Endo/Other  negative endocrine ROS  Renal/GU negative Renal ROS  negative genitourinary   Musculoskeletal   Abdominal   Peds  Hematology negative hematology ROS (+)   Anesthesia Other Findings Past Medical History: No date: Dysphagia No date: Headache     Comment:  migrains Past Surgical History: 06/13/2020: ESOPHAGOGASTRODUODENOSCOPY (EGD) WITH PROPOFOL; N/A     Comment:  Procedure: ESOPHAGOGASTRODUODENOSCOPY (EGD) WITH               PROPOFOL;  Surgeon: Regis Bill, MD;  Location:               [facility] ENDOSCOPY;  Service: Endoscopy;  Laterality: N/A; BMI    Body Mass Index: 28.89 kg/m     Reproductive/Obstetrics negative OB ROS                             Anesthesia Physical Anesthesia Plan  ASA: II  Anesthesia Plan: General   Post-op Pain Management:    Induction:   PONV Risk Score and Plan:   Airway Management Planned:   Additional Equipment:   Intra-op Plan:   Post-operative Plan:   Informed Consent: I have reviewed the patients History and Physical, chart, labs and discussed the procedure including the risks, benefits and alternatives for the proposed anesthesia with the patient or authorized representative who has indicated his/her understanding and acceptance.     Dental Advisory Given  Plan Discussed  with: CRNA  Anesthesia Plan Comments:         Anesthesia Quick Evaluation

## 2020-07-15 NOTE — H&P (Signed)
Outpatient short stay form Pre-procedure 07/15/2020 11:28 AM Merlyn Lot MD, MPH  Primary Physician: Dr. Dayna Barker  Reason for visit:  History of esophagitis  History of present illness:   59 y/o lady with history of food impaction here for follow-up EGD with plan for biopsies. No family history of GI malignancies. No blood thinners. No abdominal or neck surgeries.    Current Facility-Administered Medications:  .  0.9 %  sodium chloride infusion, , Intravenous, Continuous, Billee Balcerzak, Rossie Muskrat, MD  Medications Prior to Admission  Medication Sig Dispense Refill Last Dose  . pantoprazole (PROTONIX) 40 MG tablet Take 40 mg by mouth daily.   Past Week at Unknown time     Allergies  Allergen Reactions  . Penicillins Rash     Past Medical History:  Diagnosis Date  . Dysphagia   . Headache    migrains    Review of systems:  Otherwise negative.    Physical Exam  Gen: Alert, oriented. Appears stated age.  HEENT: PERRLA. Lungs: No respiratory distress CV: RRR Abd: soft, benign, no masses Ext: No edema    Planned procedures: Proceed with EGD. The patient understands the nature of the planned procedure, indications, risks, alternatives and potential complications including but not limited to bleeding, infection, perforation, damage to internal organs and possible oversedation/side effects from anesthesia. The patient agrees and gives consent to proceed.  Please refer to procedure notes for findings, recommendations and patient disposition/instructions.     Merlyn Lot MD, MPH Gastroenterology 07/15/2020  11:28 AM

## 2020-07-15 NOTE — Transfer of Care (Signed)
Immediate Anesthesia Transfer of Care Note  Patient: Kiara Perez  Procedure(s) Performed: ESOPHAGOGASTRODUODENOSCOPY (EGD) (N/A )  Patient Location: PACU and Endoscopy Unit  Anesthesia Type:General  Level of Consciousness: drowsy  Airway & Oxygen Therapy: Patient Spontanous Breathing and Patient connected to nasal cannula oxygen  Post-op Assessment: Report given to RN  Post vital signs: Reviewed and stable  Last Vitals:  Vitals Value Taken Time  BP    Temp    Pulse 72 07/15/20 1213  Resp 18 07/15/20 1213  SpO2 100 % 07/15/20 1213  Vitals shown include unvalidated device data.  Last Pain:  Vitals:   07/15/20 1123  TempSrc: Temporal  PainSc: 0-No pain         Complications: No complications documented.

## 2020-07-16 ENCOUNTER — Encounter: Payer: Self-pay | Admitting: Gastroenterology

## 2020-07-16 LAB — SURGICAL PATHOLOGY

## 2020-07-16 NOTE — Anesthesia Postprocedure Evaluation (Signed)
Anesthesia Post Note  Patient: Kiara Perez  Procedure(s) Performed: ESOPHAGOGASTRODUODENOSCOPY (EGD) (N/A )  Patient location during evaluation: PACU Anesthesia Type: General Level of consciousness: awake and alert Pain management: pain level controlled Vital Signs Assessment: post-procedure vital signs reviewed and stable Respiratory status: spontaneous breathing, nonlabored ventilation, respiratory function stable and patient connected to nasal cannula oxygen Cardiovascular status: blood pressure returned to baseline and stable Postop Assessment: no apparent nausea or vomiting Anesthetic complications: no   No complications documented.   Last Vitals:  Vitals:   07/15/20 1212 07/15/20 1232  BP: 124/73 (!) 141/72  Pulse: 68 67  Resp: 16 16  Temp: (!) 36 C   SpO2: 100%     Last Pain:  Vitals:   07/15/20 1232  TempSrc:   PainSc: 0-No pain                 Yevette Edwards

## 2021-07-03 ENCOUNTER — Other Ambulatory Visit: Payer: Self-pay

## 2021-07-03 ENCOUNTER — Ambulatory Visit
Admission: EM | Admit: 2021-07-03 | Discharge: 2021-07-03 | Disposition: A | Discharge status: Home / Self Care | Payer: 59 | Attending: Physician Assistant | Admitting: Physician Assistant

## 2021-07-03 ENCOUNTER — Encounter: Payer: Self-pay | Admitting: Emergency Medicine

## 2021-07-03 DIAGNOSIS — J029 Acute pharyngitis, unspecified: Secondary | ICD-10-CM | POA: Diagnosis not present

## 2021-07-03 DIAGNOSIS — R0981 Nasal congestion: Secondary | ICD-10-CM | POA: Diagnosis not present

## 2021-07-03 DIAGNOSIS — Z20822 Contact with and (suspected) exposure to covid-19: Secondary | ICD-10-CM | POA: Insufficient documentation

## 2021-07-03 DIAGNOSIS — J069 Acute upper respiratory infection, unspecified: Secondary | ICD-10-CM

## 2021-07-03 DIAGNOSIS — R051 Acute cough: Secondary | ICD-10-CM | POA: Diagnosis not present

## 2021-07-03 LAB — GROUP A STREP BY PCR: Group A Strep by PCR: NOT DETECTED

## 2021-07-03 MED ORDER — PROMETHAZINE-DM 6.25-15 MG/5ML PO SYRP: 5.0000 mL | ORAL_SOLUTION | Freq: Four times a day (QID) | ORAL | 0 refills | Status: AC | PRN

## 2021-07-03 MED ORDER — LIDOCAINE VISCOUS HCL 2 % MT SOLN: 15.0000 mL | OROMUCOSAL | 0 refills | Status: AC | PRN

## 2021-07-03 NOTE — Discharge Instructions (Addendum)

## 2021-07-03 NOTE — ED Provider Notes (Signed)
[facility]    CSN: [id number] Arrival date & time: 07/03/21  1043      History   Chief Complaint Chief Complaint  Patient presents with   Cough   Nasal Congestion    HPI Kiara Perez is a 60 y.o. female presenting for 2-day history of nasal congestion, "deep cough" and sore throat.  Patient says her throat is very sore.  She denies any associated fevers, fatigue, body aches, chest pain or breathing problem.  No vomiting or diarrhea.  No sick contacts or known exposure to COVID or flu.  She says one of the drivers at her work as strep but she is unsure if she is around them.  She has not taken any medication over-the-counter for her symptoms.  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  Patient reports being up-to-date with vaccinations on COVID and flu.  HPI  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     Past Surgical History:  Procedure Laterality Date   ESOPHAGOGASTRODUODENOSCOPY N/A 07/15/2020   Procedure: ESOPHAGOGASTRODUODENOSCOPY (EGD);  Surgeon: Regis Bill, MD;  Location: [facility] ENDOSCOPY;  Service: Endoscopy;  Laterality: N/A;   ESOPHAGOGASTRODUODENOSCOPY (EGD) WITH PROPOFOL N/A 06/13/2020   Procedure: ESOPHAGOGASTRODUODENOSCOPY (EGD) WITH PROPOFOL;  Surgeon: Regis Bill, MD;  Location: [facility] ENDOSCOPY;  Service: Endoscopy;  Laterality: N/A;    OB History   No obstetric history on file.      Home Medications    Prior to Admission medications   Medication Sig Start Date End Date Taking? Authorizing Provider  lidocaine (XYLOCAINE) 2 % solution Use as directed 15 mLs in the mouth or throat every 3 (three) hours as needed for mouth pain (swish and spit). 07/03/21  Yes Shirlee Latch, PA-C   promethazine-dextromethorphan (PROMETHAZINE-DM) 6.25-15 MG/5ML syrup Take 5 mLs by mouth 4 (four) times daily as needed. 07/03/21  Yes Eusebio Friendly B, PA-C  pantoprazole (PROTONIX) 40 MG tablet Take 40 mg by mouth daily.    [provider]    Family History History reviewed. No pertinent family history.  Social History Social History   Tobacco Use   Smoking status: Never   Smokeless tobacco: Never  Vaping Use   Vaping Use: Never used  Substance Use Topics   Alcohol use: No   Drug use: No     Allergies   Penicillins   Review of Systems Review of Systems  Constitutional:  Negative for chills, diaphoresis, fatigue and fever.  HENT:  Positive for congestion, postnasal drip, rhinorrhea and sore throat. Negative for ear pain, sinus pressure and sinus pain.   Respiratory:  Positive for cough. Negative for shortness of breath.   Gastrointestinal:  Negative for abdominal pain, nausea and vomiting.  Musculoskeletal:  Negative for arthralgias and myalgias.  Skin:  Negative for rash.  Neurological:  Negative for weakness and headaches.  Hematological:  Negative for adenopathy.    Physical Exam Triage Vital Signs ED Triage Vitals  Enc Vitals Group     BP 07/03/21 1121 128/87     Pulse Rate 07/03/21 1121 (!) 108     Resp 07/03/21 1121 16     Temp 07/03/21 1121 98.6 F (37  C)     Temp Source 07/03/21 1121 Oral     SpO2 07/03/21 1121 96 %     Weight --      Height --      Head Circumference --      Peak Flow --      Pain Score 07/03/21 1120 0     Pain Loc --      Pain Edu? --      Excl. in GC? --    No data found.  Updated Vital Signs BP 128/87 (BP Location: Right Arm)    Pulse (!) 108    Temp 98.6 F (37 C) (Oral)    Resp 16    SpO2 96%   Physical Exam Vitals and nursing note reviewed.  Constitutional:      General: She is not in acute distress.    Appearance: Normal appearance. She is not ill-appearing or toxic-appearing.  HENT:     Head:  Normocephalic and atraumatic.     Nose: Congestion and rhinorrhea present.     Mouth/Throat:     Mouth: Mucous membranes are moist.     Pharynx: Oropharynx is clear. Posterior oropharyngeal erythema present.  Eyes:     General: No scleral icterus.       Right eye: No discharge.        Left eye: No discharge.     Conjunctiva/sclera: Conjunctivae normal.  Cardiovascular:     Rate and Rhythm: Normal rate and regular rhythm.     Heart sounds: Normal heart sounds.  Pulmonary:     Effort: Pulmonary effort is normal. No respiratory distress.     Breath sounds: Normal breath sounds.  Musculoskeletal:     Cervical back: Neck supple.  Skin:    General: Skin is dry.  Neurological:     General: No focal deficit present.     Mental Status: She is alert. Mental status is at baseline.     Motor: No weakness.     Gait: Gait normal.  Psychiatric:        Mood and Affect: Mood normal.        Behavior: Behavior normal.        Thought Content: Thought content normal.     UC Treatments / Results  Labs (all labs ordered are listed, but only abnormal results are displayed) Labs Reviewed  SARS CORONAVIRUS 2 (TAT 6-24 HRS)  GROUP A STREP BY PCR    EKG   Radiology No results found.  Procedures Procedures (including critical care time)  Medications Ordered in UC Medications - No data to display  Initial Impression / Assessment and Plan / UC Course  I have reviewed the triage vital signs and the nursing notes.  Pertinent labs & imaging results that were available during my care of the patient were reviewed by me and considered in my medical decision making (see chart for details).   60 year old female presenting for 2-day history of cough, sore throat and congestion.  Vitals all stable.  No fever.  Mild nasal congestion and clear rhinorrhea on exam as well as mild posterior pharyngeal erythema.  Chest clear to auscultation.  PCR strep test negative.  PCR COVID test obtained.  Current  CDC guidance, isolation protocol and ED precautions reviewed if positive.  Advised patient symptoms consistent with viral URI.  Supportive care encouraged with increasing rest and fluids.  Advised Promethazine DM and viscous lidocaine for symptoms which I sent to pharmacy.  Reviewed return and ER precautions.  Final Clinical Impressions(s) / UC Diagnoses   Final diagnoses:  Viral upper respiratory tract infection  Nasal congestion  Acute cough  Sore throat     Discharge Instructions      You have received COVID testing today either for positive exposure, concerning symptoms that could be related to COVID infection, screening purposes, or re-testing after confirmed positive.  Your test obtained today checks for active viral infection in the last 1-2 weeks. If your test is negative now, you can still test positive later. So, if you do develop symptoms you should either get re-tested and/or isolate x 5 days and then strict mask use x 5 days (unvaccinated) or mask use x 10 days (vaccinated). Please follow CDC guidelines.  While Rapid antigen tests come back in 15-20 minutes, send out PCR/molecular test results typically come back within 1-3 days. In the mean time, if you are symptomatic, assume this could be a positive test and treat/monitor yourself as if you do have COVID.   We will call with test results if positive. Please download the MyChart app and set up a profile to access test results.   If symptomatic, go home and rest. Push fluids. Take Tylenol as needed for discomfort. Gargle warm salt water. Throat lozenges. Take Mucinex DM or Robitussin for cough. Humidifier in bedroom to ease coughing. Warm showers. Also review the COVID handout for more information.  COVID-19 INFECTION: The incubation period of COVID-19 is approximately 14 days after exposure, with most symptoms developing in roughly 4-5 days. Symptoms may range in severity from mild to critically severe. Roughly 80% of those  infected will have mild symptoms. People of any age may become infected with COVID-19 and have the ability to transmit the virus. The most common symptoms include: fever, fatigue, cough, body aches, headaches, sore throat, nasal congestion, shortness of breath, nausea, vomiting, diarrhea, changes in smell and/or taste.    COURSE OF ILLNESS Some patients may begin with mild disease which can progress quickly into critical symptoms. If your symptoms are worsening please call ahead to the Emergency Department and proceed there for further treatment. Recovery time appears to be roughly 1-2 weeks for mild symptoms and 3-6 weeks for severe disease.   GO IMMEDIATELY TO ER FOR FEVER YOU ARE UNABLE TO GET DOWN WITH TYLENOL, BREATHING PROBLEMS, CHEST PAIN, FATIGUE, LETHARGY, INABILITY TO EAT OR DRINK, ETC  QUARANTINE AND ISOLATION: To help decrease the spread of COVID-19 please remain isolated if you have COVID infection or are highly suspected to have COVID infection. This means -stay home and isolate to one room in the home if you live with others. Do not share a bed or bathroom with others while ill, sanitize and wipe down all countertops and keep common areas clean and disinfected. Stay home for 5 days. If you have no symptoms or your symptoms are resolving after 5 days, you can leave your house. Continue to wear a mask around others for 5 additional days. If you have been in close contact (within 6 feet) of someone diagnosed with COVID 19, you are advised to quarantine in your home for 14 days as symptoms can develop anywhere from 2-14 days after exposure to the virus. If you develop symptoms, you  must isolate.  Most current guidelines for COVID after exposure -unvaccinated: isolate 5 days and strict mask use x 5 days. Test on day 5 is possible -vaccinated: wear mask x 10 days if symptoms do not develop -You do not necessarily need to be  tested for COVID if you have + exposure and  develop symptoms. Just  isolate at home x10 days from symptom onset During this global pandemic, CDC advises to practice social distancing, try to stay at least 57ft away from others at all times. Wear a face covering. Wash and sanitize your hands regularly and avoid going anywhere that is not necessary.  KEEP IN MIND THAT THE COVID TEST IS NOT 100% ACCURATE AND YOU SHOULD STILL DO EVERYTHING TO PREVENT POTENTIAL SPREAD OF VIRUS TO OTHERS (WEAR MASK, WEAR GLOVES, WASH HANDS AND SANITIZE REGULARLY). IF INITIAL TEST IS NEGATIVE, THIS MAY NOT MEAN YOU ARE DEFINITELY NEGATIVE. MOST ACCURATE TESTING IS DONE 5-7 DAYS AFTER EXPOSURE.   It is not advised by CDC to get re-tested after receiving a positive COVID test since you can still test positive for weeks to months after you have already cleared the virus.   *If you have not been vaccinated for COVID, I strongly suggest you consider getting vaccinated as long as there are no contraindications.       ED Prescriptions     Medication Sig Dispense Auth. Provider   lidocaine (XYLOCAINE) 2 % solution Use as directed 15 mLs in the mouth or throat every 3 (three) hours as needed for mouth pain (swish and spit). 100 mL Eusebio Friendly B, PA-C   promethazine-dextromethorphan (PROMETHAZINE-DM) 6.25-15 MG/5ML syrup Take 5 mLs by mouth 4 (four) times daily as needed. 118 mL Shirlee Latch, PA-C      PDMP not reviewed this encounter.   Shirlee Latch, PA-C 07/03/21 [phone]

## 2021-07-03 NOTE — ED Triage Notes (Signed)
Pt presents today with c/o of nasal congestion, cough, sore throat  x 3 days. Denies fever. Pain 0/10.

## 2021-07-04 LAB — SARS CORONAVIRUS 2 (TAT 6-24 HRS): SARS Coronavirus 2: NEGATIVE

## 2022-10-23 ENCOUNTER — Encounter: Payer: Self-pay | Admitting: Internal Medicine

## 2022-10-23 ENCOUNTER — Other Ambulatory Visit: Payer: Self-pay

## 2022-10-23 ENCOUNTER — Ambulatory Visit
Admission: EM | Admit: 2022-10-23 | Discharge: 2022-10-23 | Disposition: A | Discharge status: Home / Self Care | Payer: 59 | Attending: Emergency Medicine | Admitting: Emergency Medicine

## 2022-10-23 ENCOUNTER — Ambulatory Visit: Admit: 2022-10-23 | Payer: 59 | Admitting: Internal Medicine

## 2022-10-23 ENCOUNTER — Emergency Department: Payer: 59

## 2022-10-23 ENCOUNTER — Encounter
Admission: EM | Disposition: A | Discharge status: Home / Self Care | Payer: Self-pay | Source: Home / Self Care | Attending: Emergency Medicine

## 2022-10-23 ENCOUNTER — Ambulatory Visit: Payer: 59 | Admitting: Anesthesiology

## 2022-10-23 DIAGNOSIS — T18128A Food in esophagus causing other injury, initial encounter: Secondary | ICD-10-CM

## 2022-10-23 DIAGNOSIS — T18108A Unspecified foreign body in esophagus causing other injury, initial encounter: Secondary | ICD-10-CM | POA: Diagnosis not present

## 2022-10-23 DIAGNOSIS — W44F3XA Food entering into or through a natural orifice, initial encounter: Secondary | ICD-10-CM | POA: Diagnosis not present

## 2022-10-23 DIAGNOSIS — K2100 Gastro-esophageal reflux disease with esophagitis, without bleeding: Secondary | ICD-10-CM | POA: Insufficient documentation

## 2022-10-23 DIAGNOSIS — R0989 Other specified symptoms and signs involving the circulatory and respiratory systems: Secondary | ICD-10-CM | POA: Diagnosis not present

## 2022-10-23 DIAGNOSIS — K222 Esophageal obstruction: Secondary | ICD-10-CM | POA: Diagnosis not present

## 2022-10-23 DIAGNOSIS — Z79899 Other long term (current) drug therapy: Secondary | ICD-10-CM | POA: Diagnosis not present

## 2022-10-23 DIAGNOSIS — Z743 Need for continuous supervision: Secondary | ICD-10-CM | POA: Diagnosis not present

## 2022-10-23 DIAGNOSIS — I10 Essential (primary) hypertension: Secondary | ICD-10-CM | POA: Diagnosis not present

## 2022-10-23 DIAGNOSIS — Z0389 Encounter for observation for other suspected diseases and conditions ruled out: Secondary | ICD-10-CM | POA: Diagnosis not present

## 2022-10-23 DIAGNOSIS — T17920A Food in respiratory tract, part unspecified causing asphyxiation, initial encounter: Secondary | ICD-10-CM | POA: Diagnosis not present

## 2022-10-23 HISTORY — PX: ESOPHAGOGASTRODUODENOSCOPY: SHX5428

## 2022-10-23 LAB — BASIC METABOLIC PANEL
Anion gap: 8 (ref 5–15)
BUN: 7 mg/dL — ABNORMAL LOW (ref 8–23)
CO2: 25 mmol/L (ref 22–32)
Calcium: 9.4 mg/dL (ref 8.9–10.3)
Chloride: 108 mmol/L (ref 98–111)
Creatinine, Ser: 1 mg/dL (ref 0.44–1.00)
GFR, Estimated: >60 mL/min (ref >60)
Glucose, Bld: 127 mg/dL — ABNORMAL HIGH (ref 70–99)
Potassium: 3.4 mmol/L — ABNORMAL LOW (ref 3.5–5.1)
Sodium: 141 mmol/L (ref 135–145)

## 2022-10-23 LAB — CBC WITH DIFFERENTIAL/PLATELET
Abs Immature Granulocytes: 0.03 10*3/uL (ref 0.00–0.07)
Basophils Absolute: 0.1 10*3/uL (ref 0.0–0.1)
Basophils Relative: 1 %
Eosinophils Absolute: 0.3 10*3/uL (ref 0.0–0.5)
Eosinophils Relative: 5 %
HCT: 42 % (ref 36.0–46.0)
Hemoglobin: 13.9 g/dL (ref 12.0–15.0)
Immature Granulocytes: 1 %
Lymphocytes Relative: 24 %
Lymphs Abs: 1.6 10*3/uL (ref 0.7–4.0)
MCH: 29.7 pg (ref 26.0–34.0)
MCHC: 33.1 g/dL (ref 30.0–36.0)
MCV: 89.7 fL (ref 80.0–100.0)
Monocytes Absolute: 0.3 10*3/uL (ref 0.1–1.0)
Monocytes Relative: 4 %
Neutro Abs: 4.4 10*3/uL (ref 1.7–7.7)
Neutrophils Relative %: 65 %
Platelets: 288 10*3/uL (ref 150–400)
RBC: 4.68 MIL/uL (ref 3.87–5.11)
RDW: 12.2 % (ref 11.5–15.5)
WBC: 6.7 10*3/uL (ref 4.0–10.5)
nRBC: 0 % (ref 0.0–0.2)

## 2022-10-23 SURGERY — EGD (ESOPHAGOGASTRODUODENOSCOPY)
Anesthesia: General

## 2022-10-23 MED ORDER — SUCCINYLCHOLINE CHLORIDE 200 MG/10ML IV SOSY
PREFILLED_SYRINGE | INTRAVENOUS | Status: DC | PRN
  Administered 2022-10-23: 100 mg via INTRAVENOUS

## 2022-10-23 MED ORDER — ONDANSETRON HCL 4 MG/2ML IJ SOLN: 4.0000 mg | Freq: Once | INTRAMUSCULAR | Status: DC | PRN

## 2022-10-23 MED ORDER — MIDAZOLAM HCL 2 MG/2ML IJ SOLN
INTRAMUSCULAR | Status: DC | PRN
  Administered 2022-10-23: 2 mg via INTRAVENOUS

## 2022-10-23 MED ORDER — DEXAMETHASONE SODIUM PHOSPHATE 10 MG/ML IJ SOLN
INTRAMUSCULAR | Status: DC | PRN
  Administered 2022-10-23: 10 mg via INTRAVENOUS

## 2022-10-23 MED ORDER — GLUCAGON HCL RDNA (DIAGNOSTIC) 1 MG IJ SOLR
1.0000 mg | Freq: Once | INTRAMUSCULAR | Status: AC
  Administered 2022-10-23: 1 mg via INTRAVENOUS
  Filled 2022-10-23: qty 1

## 2022-10-23 MED ORDER — LIDOCAINE HCL (CARDIAC) PF 100 MG/5ML IV SOSY
PREFILLED_SYRINGE | INTRAVENOUS | Status: DC | PRN
  Administered 2022-10-23: 100 mg via INTRAVENOUS

## 2022-10-23 MED ORDER — MIDAZOLAM HCL 2 MG/2ML IJ SOLN
INTRAMUSCULAR | Status: AC
  Filled 2022-10-23: qty 2

## 2022-10-23 MED ORDER — SODIUM CHLORIDE 0.9 % IV SOLN: INTRAVENOUS | Status: DC

## 2022-10-23 MED ORDER — PROPOFOL 10 MG/ML IV BOLUS
INTRAVENOUS | Status: AC
  Filled 2022-10-23: qty 20

## 2022-10-23 MED ORDER — FENTANYL CITRATE (PF) 100 MCG/2ML IJ SOLN
INTRAMUSCULAR | Status: AC
  Filled 2022-10-23: qty 2

## 2022-10-23 MED ORDER — PROPOFOL 10 MG/ML IV BOLUS
INTRAVENOUS | Status: DC | PRN
  Administered 2022-10-23: 40 mg via INTRAVENOUS
  Administered 2022-10-23: 160 mg via INTRAVENOUS

## 2022-10-23 MED ORDER — FENTANYL CITRATE (PF) 100 MCG/2ML IJ SOLN
INTRAMUSCULAR | Status: DC | PRN
  Administered 2022-10-23: 50 ug via INTRAVENOUS

## 2022-10-23 MED ORDER — FENTANYL CITRATE PF 50 MCG/ML IJ SOSY: 25.0000 ug | PREFILLED_SYRINGE | INTRAMUSCULAR | Status: DC | PRN

## 2022-10-23 MED ORDER — ONDANSETRON HCL 4 MG/2ML IJ SOLN
4.0000 mg | Freq: Once | INTRAMUSCULAR | Status: AC
  Administered 2022-10-23: 4 mg via INTRAVENOUS
  Filled 2022-10-23: qty 2

## 2022-10-23 NOTE — ED Provider Notes (Signed)
[facility] Provider Note    Event Date/Time   First MD Initiated Contact with Patient 10/23/22 1202     (approximate)   History   Choking   HPI  Kiara Perez is a 62 y.o. female   Past medical history of esophagitis, GERD, and prior esophageal food impaction who presents to the emergency department with concern of Food impaction,.  She ate some ham this morning and now has the sensation of foreign body at the level of the sternum. Regurgitating food and beverage when attempting to eat or drink.  No respiratory symptoms.     External Medical Documents Reviewed: 2021 endoscopy showing esophagitis      Physical Exam   Triage Vital Signs: ED Triage Vitals [10/23/22 1159]  Enc Vitals Group     BP      Pulse      Resp      Temp      Temp src      SpO2      Weight      Height      Head Circumference      Peak Flow      Pain Score 4     Pain Loc      Pain Edu?      Excl. in GC?     Most recent vital signs: Vitals:   10/23/22 1420 10/23/22 1430  BP: (!) 144/83 (!) 116/9  Pulse:    Resp:    Temp: 98.3 F (36.8 C)   SpO2:      General: Awake, no distress.  CV:  Good peripheral perfusion.  Resp:  Normal effort.  Abd:  No distention.  Other:     ED Results / Procedures / Treatments   Labs (all labs ordered are listed, but only abnormal results are displayed) Labs Reviewed  BASIC METABOLIC PANEL - Abnormal; Notable for the following components:      Result Value   Potassium 3.4 (*)    Glucose, Bld 127 (*)    BUN 7 (*)    All other components within normal limits  CBC WITH DIFFERENTIAL/PLATELET     I ordered and reviewed the above labs they are notable for normal cell counts.    RADIOLOGY I independently reviewed and interpreted x-ray of the chest and see no obvious foreign body   PROCEDURES:  Critical Care performed: No  Procedures   MEDICATIONS ORDERED IN ED: Medications  0.9 %  sodium chloride infusion  ( Intravenous Anesthesia Volume Adjustment 10/23/22 1405)  fentaNYL (SUBLIMAZE) injection 25 mcg (has no administration in time range)  ondansetron (ZOFRAN) injection 4 mg (has no administration in time range)  glucagon (human recombinant) (GLUCAGEN) injection 1 mg (1 mg Intravenous Given 10/23/22 1214)  ondansetron (ZOFRAN) injection 4 mg (4 mg Intravenous Given 10/23/22 1215)    External physician / consultants:  I spoke with Dr. Norma Fredricksonoledo of GI regarding care plan for this patient.   IMPRESSION / MDM / ASSESSMENT AND PLAN / ED COURSE  I reviewed the triage vital signs and the nursing notes.                                Patient's presentation is most consistent with acute presentation with potential threat to life or bodily function.  Differential diagnosis includes, but is not limited to, food impaction, esophageal rupture,    MDM: Patient with symptoms consistent with  food impaction of the esophagus with a history of the same.  She otherwise appears comfortable there is no crepitus on my examination and x-ray shows no free air so I doubt more emergent pathology like esophageal perforation.  GI consulted and will take for endoscopy.        FINAL CLINICAL IMPRESSION(S) / ED DIAGNOSES   Final diagnoses:  Esophageal obstruction due to food impaction     Rx / DC Orders   ED Discharge Orders     None        Note:  This document was prepared using Dragon voice recognition software and may include unintentional dictation errors.    Pilar Jarvis, MD 10/23/22 [phone]

## 2022-10-23 NOTE — Anesthesia Procedure Notes (Signed)
Procedure Name: Intubation Date/Time: 10/23/2022 1:57 PM  Performed by: Katherine Basset, CRNAPre-anesthesia Checklist: Patient identified, Emergency Drugs available, Suction available and Patient being monitored Patient Re-evaluated:Patient Re-evaluated prior to induction Oxygen Delivery Method: Circle system utilized Preoxygenation: Pre-oxygenation with 100% oxygen Induction Type: IV induction and Rapid sequence Grade View: Grade II Tube type: Oral Tube size: 7.0 mm Number of attempts: 1 Airway Equipment and Method: Stylet and Video-laryngoscopy Placement Confirmation: ETT inserted through vocal cords under direct vision, positive ETCO2 and breath sounds checked- equal and bilateral Secured at: 22 cm Tube secured with: Tape Dental Injury: Teeth and Oropharynx as per pre-operative assessment

## 2022-10-23 NOTE — ED Notes (Signed)
Pt taken to xray at this time.

## 2022-10-23 NOTE — ED Notes (Signed)
Basic labs sent. MD at bedside

## 2022-10-23 NOTE — ED Triage Notes (Addendum)
Pt comes via EMS from home with c/o choking on ham biscuit about hour ago. Pt has hx of acid reflux. Pt states it has moved a little further down but still there. Pt did have the hemlic done.   Pt ambulatory to stretcher.   BP-180/100 97% RA  Pt states some nausea. Pt did have vomit episode with ems. Pt speaking clear sentences. Pt states hx of this 3 years ago and cola and meds did not work. Pt states she had to have surgery.

## 2022-10-23 NOTE — ED Notes (Signed)
Pt taken to OR with RN and family.

## 2022-10-23 NOTE — H&P (View-Only) (Signed)
  [facility] GI Inpatient Consult Note   Sueanne Maniaci Keith Da Authement, M.D.  Reason for Consult: Esophageal food impaction, dysphagia   Attending Requesting Consult: Silas Wong, M.D.   History of Present Illness: Kiara Perez is a 62 y.o. female presenting approximately 2.5 hrs after attempting to swallow ham. Patient has a hx of GERD and takes Omeprazole daily on a regular basis which seems to control her symptoms of GERD well. Patient has a known hx of esophagitis and previous esophageal food impaction requiring emergent EGD and removal in 2021, November - Dr Locklear. Patient was brought back again for repeat EGD in December 2021 and had a Schatzki Ring dilated with a TTS balloon to 18mm. Patient attempted to cough out food but was unable. She is alert and oriented, in no pain, but is unable to keep oral secretions down. She is spitting up in an emesis basin.  Past Medical History:  Past Medical History:  Diagnosis Date   Dysphagia    Headache    migrains    Problem List: Patient Active Problem List   Diagnosis Date Noted   Foreign body in esophagus     Past Surgical History: Past Surgical History:  Procedure Laterality Date   ESOPHAGOGASTRODUODENOSCOPY N/A 07/15/2020   Procedure: ESOPHAGOGASTRODUODENOSCOPY (EGD);  Surgeon: Locklear, Cameron T, MD;  Location: [facility] ENDOSCOPY;  Service: Endoscopy;  Laterality: N/A;   ESOPHAGOGASTRODUODENOSCOPY (EGD) WITH PROPOFOL N/A 06/13/2020   Procedure: ESOPHAGOGASTRODUODENOSCOPY (EGD) WITH PROPOFOL;  Surgeon: Locklear, Cameron T, MD;  Location: [facility] ENDOSCOPY;  Service: Endoscopy;  Laterality: N/A;    Allergies: Allergies  Allergen Reactions   Penicillins Rash    Home Medications: (Not in a hospital admission)  Home medication reconciliation was completed with the patient.   Scheduled Inpatient Medications:    Continuous Inpatient Infusions:    PRN Inpatient Medications:    Family History: family history is not on file.    GI Family History: Negative  Social History:   reports that she has never smoked. She has never used smokeless tobacco. She reports that she does not drink alcohol and does not use drugs. The patient denies ETOH, tobacco, or drug use.    Review of Systems: Review of Systems - Negative except that in HPI.  Physical Examination: BP (!) 179/90   Pulse 71   Temp 98 F (36.7 C)   Resp 18   SpO2 95%  Physical Exam Constitutional:      General: She is not in acute distress.    Appearance: Normal appearance. She is not diaphoretic.  HENT:     Head: Normocephalic and atraumatic.     Nose: Nose normal.  Eyes:     General: No scleral icterus.    Extraocular Movements: Extraocular movements intact.     Conjunctiva/sclera: Conjunctivae normal.     Pupils: Pupils are equal, round, and reactive to light.  Cardiovascular:     Rate and Rhythm: Normal rate and regular rhythm.     Pulses: Normal pulses.  Pulmonary:     Effort: Pulmonary effort is normal. No respiratory distress.     Breath sounds: Normal breath sounds. No wheezing or rhonchi.  Abdominal:     General: Bowel sounds are normal.     Palpations: Abdomen is soft.  Musculoskeletal:        General: Normal range of motion.     Cervical back: Normal range of motion.  Skin:    General: Skin is warm and dry.     Coloration:   Skin is not jaundiced.     Findings: No bruising.  Neurological:     General: No focal deficit present.     Mental Status: She is alert. Mental status is at baseline.  Psychiatric:        Mood and Affect: Mood normal.        Behavior: Behavior normal.        Thought Content: Thought content normal.        Judgment: Judgment normal.     Data: Lab Results  Component Value Date   WBC 6.7 10/23/2022   HGB 13.9 10/23/2022   HCT 42.0 10/23/2022   MCV 89.7 10/23/2022   PLT 288 10/23/2022   Recent Labs  Lab 10/23/22 1208  HGB 13.9   Lab Results  Component Value Date   NA 141 10/23/2022   K 3.4  (L) 10/23/2022   CL 108 10/23/2022   CO2 25 10/23/2022   BUN 7 (L) 10/23/2022   CREATININE 1.00 10/23/2022   Lab Results  Component Value Date   ALT 16 05/17/2016   AST 25 05/17/2016   ALKPHOS 132 (H) 05/17/2016   BILITOT 0.5 05/17/2016   No results for input(s): "APTT", "INR", "PTT" in the last 168 hours.    Latest Ref Rng & Units 10/23/2022   12:08 PM 05/17/2016    6:54 PM 10/15/2011    8:33 PM  CBC  WBC 4.0 - 10.5 K/uL 6.7  9.1  6.1   Hemoglobin 12.0 - 15.0 g/dL 31.5  17.6  16.0   Hematocrit 36.0 - 46.0 % 42.0  45.1  41.0   Platelets 150 - 400 K/uL 288  237  224     STUDIES: DG Neck Soft Tissue  Result Date: 10/23/2022 CLINICAL DATA:  Choked on a ham biscuit. EXAM: NECK SOFT TISSUES - 1+ VIEW COMPARISON:  Cervical spine x-rays dated February 19, 2017. FINDINGS: There is no evidence of retropharyngeal soft tissue swelling or epiglottic enlargement. The cervical airway is unremarkable and no radio-opaque foreign body identified. IMPRESSION: Negative. Electronically Signed   By: Obie Dredge M.D.   On: 10/23/2022 12:28   DG Chest 2 View  Result Date: 10/23/2022 CLINICAL DATA:  Choked on a ham biscuit. EXAM: CHEST - 2 VIEW COMPARISON:  Chest x-ray dated June 13, 2020. FINDINGS: The heart size and mediastinal contours are within normal limits. Both lungs are clear. The visualized skeletal structures are unremarkable. IMPRESSION: No active cardiopulmonary disease. Electronically Signed   By: Obie Dredge M.D.   On: 10/23/2022 12:27   @IMAGES @  Assessment:  GERD - managed on Omeprazole daily. Hx of Schatzki Ring s/p dilation  Esophageal obstruction via food impaction. Dysphagia secondary to #3.  Recommendations:  Urgent EGD with foreign body removal. The patient understands the nature of the planned procedure, indications, risks, alternatives and potential complications including but not limited to bleeding, infection, perforation, damage to internal organs and possible  oversedation/side effects from anesthesia. The patient agrees and gives consent to proceed.  Please refer to procedure notes for findings, recommendations and patient disposition/instructions. Further recommendations to follow.  Thank you for the consult. Please call with questions or concerns.  Rosina Lowenstein, "Mellody Dance MD [facility] [address] [place], [place] [address] [phone]  10/23/2022 1:20 PM

## 2022-10-23 NOTE — Transfer of Care (Signed)
Immediate Anesthesia Transfer of Care Note  Patient: Kiara Perez  Procedure(s) Performed: ESOPHAGOGASTRODUODENOSCOPY (EGD)  Patient Location: PACU  Anesthesia Type:General  Level of Consciousness: awake, alert , and oriented  Airway & Oxygen Therapy: Patient Spontanous Breathing and Patient connected to face mask oxygen  Post-op Assessment: Report given to RN, Post -op Vital signs reviewed and stable, and Patient moving all extremities  Post vital signs: Reviewed and stable  Last Vitals:  Vitals Value Taken Time  BP 144/83 10/23/22 1419  Temp    Pulse 72 10/23/22 1420  Resp 18 10/23/22 1420  SpO2 100 % 10/23/22 1420  Vitals shown include unvalidated device data.  Last Pain:  Vitals:   10/23/22 1159  PainSc: 4          Complications: No notable events documented.

## 2022-10-23 NOTE — Anesthesia Postprocedure Evaluation (Signed)
Anesthesia Post Note  Patient: Kiara Perez  Procedure(s) Performed: ESOPHAGOGASTRODUODENOSCOPY (EGD)  Patient location during evaluation: PACU Anesthesia Type: General Level of consciousness: sedated Pain management: pain level controlled Vital Signs Assessment: post-procedure vital signs reviewed and stable Respiratory status: spontaneous breathing and respiratory function stable Cardiovascular status: blood pressure returned to baseline and stable Anesthetic complications: no   No notable events documented.   Last Vitals:  Vitals:   10/23/22 1420 10/23/22 1430  BP: (!) 144/83 (!) 116/9  Pulse:    Resp:    Temp: 36.8 C   SpO2:      Last Pain:  Vitals:   10/23/22 1430  TempSrc:   PainSc: 0-No pain                 VAN STAVEREN,Ludean Duhart

## 2022-10-23 NOTE — Consult Note (Signed)
[facility]   Jamey Reaseodoro Keith Yanette Tripoli, M.D.  Reason for Consult: Esophageal food impaction, dysphagia   Attending Requesting Consult: Pilar JarvisSilas Wong, M.D.   History of Present Illness: Kiara Perez is a 62 y.o. female presenting approximately 2.5 hrs after attempting to swallow ham. Patient has a hx of GERD and takes Omeprazole daily on a regular basis which seems to control her symptoms of GERD well. Patient has a known hx of esophagitis and previous esophageal food impaction requiring emergent EGD and removal in 2021, November - Dr Mia CreekLocklear. Patient was brought back again for repeat EGD in December 2021 and had a Schatzki Ring dilated with a TTS balloon to 18mm. Patient attempted to cough out food but was unable. She is alert and oriented, in no pain, but is unable to keep oral secretions down. She is spitting up in an emesis basin.  Past Medical History:  Past Medical History:  Diagnosis Date   Dysphagia    Headache    migrains    Problem List: Patient Active Problem List   Diagnosis Date Noted   Foreign body in esophagus     Past Surgical History: Past Surgical History:  Procedure Laterality Date   ESOPHAGOGASTRODUODENOSCOPY N/A 07/15/2020   Procedure: ESOPHAGOGASTRODUODENOSCOPY (EGD);  Surgeon: Regis BillLocklear, Cameron T, MD;  [facility];  Service: Endoscopy;  Laterality: N/A;   ESOPHAGOGASTRODUODENOSCOPY (EGD) WITH PROPOFOL N/A 06/13/2020   Procedure: ESOPHAGOGASTRODUODENOSCOPY (EGD) WITH PROPOFOL;  Surgeon: Regis BillLocklear, Cameron T, MD;  Location: [facility] ENDOSCOPY;  Service: Endoscopy;  Laterality: N/A;    Allergies: Allergies  Allergen Reactions   Penicillins Rash    Home Medications: (Not in a hospital admission)  Home medication reconciliation was completed with the patient.   Scheduled Inpatient Medications:    Continuous Inpatient Infusions:    PRN Inpatient Medications:    Family History: family history is not on file.    GI Family History: Negative  Social History:   reports that she has never smoked. She has never used smokeless tobacco. She reports that she does not drink alcohol and does not use drugs. The patient denies ETOH, tobacco, or drug use.    Review of Systems: Review of Systems - Negative except that in HPI.  Physical Examination: BP (!) 179/90   Pulse 71   Temp 98 F (36.7 C)   Resp 18   SpO2 95%  Physical Exam Constitutional:      General: She is not in acute distress.    Appearance: Normal appearance. She is not diaphoretic.  HENT:     Head: Normocephalic and atraumatic.     Nose: Nose normal.  Eyes:     General: No scleral icterus.    Extraocular Movements: Extraocular movements intact.     Conjunctiva/sclera: Conjunctivae normal.     Pupils: Pupils are equal, round, and reactive to light.  Cardiovascular:     Rate and Rhythm: Normal rate and regular rhythm.     Pulses: Normal pulses.  Pulmonary:     Effort: Pulmonary effort is normal. No respiratory distress.     Breath sounds: Normal breath sounds. No wheezing or rhonchi.  Abdominal:     General: Bowel sounds are normal.     Palpations: Abdomen is soft.  Musculoskeletal:        General: Normal range of motion.     Cervical back: Normal range of motion.  Skin:    General: Skin is warm and dry.     Coloration:  Skin is not jaundiced.     Findings: No bruising.  Neurological:     General: No focal deficit present.     Mental Status: She is alert. Mental status is at baseline.  Psychiatric:        Mood and Affect: Mood normal.        Behavior: Behavior normal.        Thought Content: Thought content normal.        Judgment: Judgment normal.     Data: Lab Results  Component Value Date   WBC 6.7 10/23/2022   HGB 13.9 10/23/2022   HCT 42.0 10/23/2022   MCV 89.7 10/23/2022   PLT 288 10/23/2022   Recent Labs  Lab 10/23/22 1208  HGB 13.9   Lab Results  Component Value Date   NA 141 10/23/2022   K 3.4  (L) 10/23/2022   CL 108 10/23/2022   CO2 25 10/23/2022   BUN 7 (L) 10/23/2022   CREATININE 1.00 10/23/2022   Lab Results  Component Value Date   ALT 16 05/17/2016   AST 25 05/17/2016   ALKPHOS 132 (H) 05/17/2016   BILITOT 0.5 05/17/2016   No results for input(s): "APTT", "INR", "PTT" in the last 168 hours.    Latest Ref Rng & Units 10/23/2022   12:08 PM 05/17/2016    6:54 PM 10/15/2011    8:33 PM  CBC  WBC 4.0 - 10.5 K/uL 6.7  9.1  6.1   Hemoglobin 12.0 - 15.0 g/dL 31.5  17.6  16.0   Hematocrit 36.0 - 46.0 % 42.0  45.1  41.0   Platelets 150 - 400 K/uL 288  237  224     STUDIES: DG Neck Soft Tissue  Result Date: 10/23/2022 CLINICAL DATA:  Choked on a ham biscuit. EXAM: NECK SOFT TISSUES - 1+ VIEW COMPARISON:  Cervical spine x-rays dated February 19, 2017. FINDINGS: There is no evidence of retropharyngeal soft tissue swelling or epiglottic enlargement. The cervical airway is unremarkable and no radio-opaque foreign body identified. IMPRESSION: Negative. Electronically Signed   By: Obie Dredge M.D.   On: 10/23/2022 12:28   DG Chest 2 View  Result Date: 10/23/2022 CLINICAL DATA:  Choked on a ham biscuit. EXAM: CHEST - 2 VIEW COMPARISON:  Chest x-ray dated June 13, 2020. FINDINGS: The heart size and mediastinal contours are within normal limits. Both lungs are clear. The visualized skeletal structures are unremarkable. IMPRESSION: No active cardiopulmonary disease. Electronically Signed   By: Obie Dredge M.D.   On: 10/23/2022 12:27   @IMAGES @  Assessment:  GERD - managed on Omeprazole daily. Hx of Schatzki Ring s/p dilation  Esophageal obstruction via food impaction. Dysphagia secondary to #3.  Recommendations:  Urgent EGD with foreign body removal. The patient understands the nature of the planned procedure, indications, risks, alternatives and potential complications including but not limited to bleeding, infection, perforation, damage to internal organs and possible  oversedation/side effects from anesthesia. The patient agrees and gives consent to proceed.  Please refer to procedure notes for findings, recommendations and patient disposition/instructions. Further recommendations to follow.  Thank you for the consult. Please call with questions or concerns.  Rosina Lowenstein, "Mellody Dance MD [facility] [address] [place], [place] [address] [phone]  10/23/2022 1:20 PM

## 2022-10-23 NOTE — ED Notes (Signed)
Report given to RN. Pt to be taken to OR at this time for procedure.

## 2022-10-23 NOTE — Interval H&P Note (Signed)
History and Physical Interval Note:  10/23/2022 1:29 PM  Kiara Perez  has presented today for surgery, with the diagnosis of Dysphagia, esophageal obstruction/foreign body.  The various methods of treatment have been discussed with the patient and family. After consideration of risks, benefits and other options for treatment, the patient has consented to  Procedure(s): ESOPHAGOGASTRODUODENOSCOPY (EGD) (N/A) as a surgical intervention.  The patient's history has been reviewed, patient examined, no change in status, stable for surgery.  I have reviewed the patient's chart and labs.  Questions were answered to the patient's satisfaction.     [place], [place]

## 2022-10-23 NOTE — Anesthesia Preprocedure Evaluation (Signed)
Anesthesia Evaluation  Patient identified by MRN, date of birth, ID band Patient awake    Reviewed: Allergy & Precautions, NPO status , Patient's Chart, lab work & pertinent test results  Airway Mallampati: III  TM Distance: <3 FB Neck ROM: Full    Dental  (+) Poor Dentition, Missing, Chipped, Dental Advisory Given   Pulmonary neg pulmonary ROS   Pulmonary exam normal breath sounds clear to auscultation       Cardiovascular Exercise Tolerance: Good negative cardio ROS Normal cardiovascular exam Rhythm:Regular Rate:Normal     Neuro/Psych  Headaches negative neurological ROS  negative psych ROS   GI/Hepatic negative GI ROS, Neg liver ROS,,,  Endo/Other  negative endocrine ROS    Renal/GU negative Renal ROS     Musculoskeletal   Abdominal Normal abdominal exam  (+)   Peds negative pediatric ROS (+)  Hematology negative hematology ROS (+)   Anesthesia Other Findings Past Medical History: No date: Dysphagia No date: Headache     Comment:  migrains  Past Surgical History: 07/15/2020: ESOPHAGOGASTRODUODENOSCOPY; N/A     Comment:  Procedure: ESOPHAGOGASTRODUODENOSCOPY (EGD);  Surgeon:               Regis Bill, MD;  Location: [facility] ENDOSCOPY;                Service: Endoscopy;  Laterality: N/A; 06/13/2020: ESOPHAGOGASTRODUODENOSCOPY (EGD) WITH PROPOFOL; N/A     Comment:  Procedure: ESOPHAGOGASTRODUODENOSCOPY (EGD) WITH               PROPOFOL;  Surgeon: Regis Bill, MD;  Location:               [facility] ENDOSCOPY;  Service: Endoscopy;  Laterality: N/A;     Reproductive/Obstetrics negative OB ROS                             Anesthesia Physical Anesthesia Plan  ASA: 2 and emergent  Anesthesia Plan: General   Post-op Pain Management:    Induction: Intravenous  PONV Risk Score and Plan: 1 and Ondansetron and Dexamethasone  Airway Management Planned: Oral  ETT  Additional Equipment:   Intra-op Plan:   Post-operative Plan: Extubation in OR  Informed Consent: I have reviewed the patients History and Physical, chart, labs and discussed the procedure including the risks, benefits and alternatives for the proposed anesthesia with the patient or authorized representative who has indicated his/her understanding and acceptance.     Dental Advisory Given  Plan Discussed with: Anesthesiologist, CRNA and Surgeon  Anesthesia Plan Comments:        Anesthesia Quick Evaluation

## 2022-10-26 ENCOUNTER — Encounter: Payer: Self-pay | Admitting: Internal Medicine

## 2022-10-28 NOTE — Op Note (Signed)
[facility] Patient Name: Kiara Perez Procedure Date: 10/23/2022 1:22 PM MRN: [id number] Account # [id number] Date of Birth: 02/14/1961 Admit Type: Outpatient Age: 62 Room: [facility] ENDO ROOM 4 Gender: Female Note Status: Finalized Instrument Name: Upper Endoscope [id number] Procedure:             Upper GI endoscopy Indications:           Removal of foreign body in the esophagus, Dysphagia Providers:             Boykin Nearing. Norma Fredrickson MD, MD Referring MD:          Leim Fabry MD, MD (Referring MD) Medicines:             General Anesthesia Complications:         No immediate complications. Estimated blood loss: None. Procedure:             Pre-Anesthesia Assessment:                        - The risks and benefits of the procedure and the                         sedation options and risks were discussed with the                         patient. All questions were answered and informed                         consent was obtained.                        - Patient identification and proposed procedure were                         verified prior to the procedure by the nurse. The                         procedure was verified in the procedure room.                        - ASA Grade Assessment: II - A patient with mild                         systemic disease.                        - After reviewing the risks and benefits, the patient                         was deemed in satisfactory condition to undergo the                         procedure.                        After obtaining informed consent, the endoscope was                         passed under direct vision. Throughout the procedure,  the patient's blood pressure, pulse, and oxygen                         saturations were monitored continuously. The Endoscope                         was introduced through the mouth, and advanced to the                         third part of  duodenum. The upper GI endoscopy was                         somewhat difficult due to presence of food. Successful                         completion of the procedure was aided by performing                         the maneuvers documented (below) in this report. The                         patient tolerated the procedure well. Findings:      Food was found in the lower third of the esophagus. Removal was       accomplished with a Raptor grasping device.      There is no endoscopic evidence of mucosal abnormalities, stenosis,       stricture, ulcerations or mass in the entire esophagus.      The stomach was normal.      The examined duodenum was normal.      The exam was otherwise without abnormality. Impression:            - Food in the lower third of the esophagus. Removal                         was successful.                        - Normal stomach.                        - Normal examined duodenum.                        - The examination was otherwise normal. Recommendation:        - Patient has a contact number available for                         emergencies. The signs and symptoms of potential                         delayed complications were discussed with the patient.                         Return to normal activities tomorrow. Written                         discharge instructions were provided to the patient.                        -  Full liquid diet for 1 day.                        - Advance diet as tolerated.                        - Chew food well and/or cut up in small pieces                        - Return to GI office PRN.                        - The findings and recommendations were discussed with                         the patient and their family. Procedure Code(s):     --- Professional ---                        [phone], Esophagogastroduodenoscopy, flexible,                         transoral; with removal of foreign body(s) Diagnosis Code(s):     ---  Professional ---                        R13.10, Dysphagia, unspecified                        T18.108A, Unspecified foreign body in esophagus                         causing other injury, initial encounter                        T18.128A, Food in esophagus causing other injury,                         initial encounter CPT copyright 2022 [employer]. All rights reserved. The codes documented in this report are preliminary and upon coder review may  be revised to meet current compliance requirements. Stanton Kidneyeodoro K Nerida Boivin MD, MD 10/23/2022 2:11:30 PM This report has been signed electronically. Number of Addenda: 0 Note Initiated On: 10/23/2022 1:22 PM Estimated Blood Loss:  Estimated blood loss: none.      [facility]

## 2022-12-15 ENCOUNTER — Ambulatory Visit
Admission: EM | Admit: 2022-12-15 | Discharge: 2022-12-15 | Disposition: A | Discharge status: Home / Self Care | Payer: 59 | Attending: Emergency Medicine | Admitting: Emergency Medicine

## 2022-12-15 DIAGNOSIS — L237 Allergic contact dermatitis due to plants, except food: Secondary | ICD-10-CM | POA: Diagnosis not present

## 2022-12-15 MED ORDER — PREDNISONE 10 MG (21) PO TBPK: ORAL_TABLET | Freq: Every day | ORAL | 0 refills | Status: AC

## 2022-12-15 MED ORDER — DEXAMETHASONE SODIUM PHOSPHATE 10 MG/ML IJ SOLN
10.0000 mg | Freq: Once | INTRAMUSCULAR | Status: AC
  Administered 2022-12-15: 10 mg via INTRAMUSCULAR

## 2022-12-15 NOTE — Discharge Instructions (Addendum)
You were given an injection of a steroid called dexamethasone.  Start the prednisone taper tomorrow as directed.    Take Zyrtec as directed.    Follow up with your primary care provider.    

## 2022-12-15 NOTE — ED Provider Notes (Signed)
Renaldo Fiddler    CSN: [id number] Arrival date & time: 12/15/22  1613      History   Chief Complaint Chief Complaint  Patient presents with   Rash    HPI AKELAH MOTEN is a 62 y.o. female.  Patient presents with pruritic rash on her forehead, arms, and legs today.  She was exposed to poison ivy yesterday.  No lesions in her eyes or mouth.  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  She denies fever, sore throat, shortness of breath, or other symptoms.  She reports history of similar rash years ago from poison ivy.    The history is provided by the patient and medical records.    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     Past Surgical History:  Procedure Laterality Date   ESOPHAGOGASTRODUODENOSCOPY N/A 07/15/2020   Procedure: ESOPHAGOGASTRODUODENOSCOPY (EGD);  Surgeon: Regis Bill, MD;  Location: [facility] ENDOSCOPY;  Service: Endoscopy;  Laterality: N/A;   ESOPHAGOGASTRODUODENOSCOPY N/A 10/23/2022   Procedure: ESOPHAGOGASTRODUODENOSCOPY (EGD);  Surgeon: Toledo, Boykin Nearing, MD;  Location: [facility] ENDOSCOPY;  Service: Gastroenterology;  Laterality: N/A;   ESOPHAGOGASTRODUODENOSCOPY (EGD) WITH PROPOFOL N/A 06/13/2020   Procedure: ESOPHAGOGASTRODUODENOSCOPY (EGD) WITH PROPOFOL;  Surgeon: Regis Bill, MD;  Location: [facility] ENDOSCOPY;  Service: Endoscopy;  Laterality: N/A;    OB History   No obstetric history on file.      Home Medications    Prior to Admission medications   Medication Sig Start Date End Date Taking? Authorizing Provider  predniSONE (STERAPRED UNI-PAK 21 TAB) 10 MG (21) TBPK tablet Take by mouth daily. As directed 12/16/22  Yes Mickie Bail, NP  lidocaine (XYLOCAINE) 2 % solution Use as directed 15 mLs in the mouth or throat every 3 (three) hours as needed for mouth pain (swish and spit).  07/03/21   Shirlee Latch, PA-C  pantoprazole (PROTONIX) 40 MG tablet Take 40 mg by mouth daily.    [provider]  promethazine-dextromethorphan (PROMETHAZINE-DM) 6.25-15 MG/5ML syrup Take 5 mLs by mouth 4 (four) times daily as needed. Patient not taking: Reported on 12/15/2022 07/03/21   Gareth Morgan    Family History History reviewed. No pertinent family history.  Social History Social History   Tobacco Use   Smoking status: Never   Smokeless tobacco: Never  Vaping Use   Vaping Use: Never used  Substance Use Topics   Alcohol use: No   Drug use: No     Allergies   Penicillins   Review of Systems Review of Systems  Constitutional:  Negative for chills and fever.  HENT:  Negative for sore throat, trouble swallowing and voice change.   Respiratory:  Negative for cough and shortness of breath.   Skin:  Positive for rash.  All other systems reviewed and are negative.    Physical Exam Triage Vital Signs ED Triage Vitals  Enc Vitals Group     BP      Pulse      Resp      Temp      Temp src      SpO2      Weight      Height      Head Circumference      Peak Flow  Pain Score      Pain Loc      Pain Edu?      Excl. in GC?    No data found.  Updated Vital Signs BP (!) 142/93   Pulse 90   Temp 97.9 F (36.6 C)   Resp 18   SpO2 96%   Visual Acuity Right Eye Distance:   Left Eye Distance:   Bilateral Distance:    Right Eye Near:   Left Eye Near:    Bilateral Near:     Physical Exam Vitals and nursing note reviewed.  Constitutional:      General: She is not in acute distress.    Appearance: She is well-developed. She is not ill-appearing.  HENT:     Mouth/Throat:     Mouth: Mucous membranes are moist.     Pharynx: Oropharynx is clear.  Eyes:     General:        Right eye: No discharge.        Left eye: No discharge.     Conjunctiva/sclera: Conjunctivae normal.     Pupils: Pupils are equal, round, and reactive to light.   Cardiovascular:     Rate and Rhythm: Normal rate and regular rhythm.     Heart sounds: Normal heart sounds.  Pulmonary:     Effort: Pulmonary effort is normal. No respiratory distress.     Breath sounds: Normal breath sounds.  Musculoskeletal:     Cervical back: Neck supple.  Skin:    General: Skin is warm and dry.     Findings: Rash present.     Comments: Scattered papular rash on extremities; one papule on right side of forehead.    Neurological:     Mental Status: She is alert.  Psychiatric:        Mood and Affect: Mood normal.        Behavior: Behavior normal.      UC Treatments / Results  Labs (all labs ordered are listed, but only abnormal results are displayed) Labs Reviewed - No data to display  EKG   Radiology No results found.  Procedures Procedures (including critical care time)  Medications Ordered in UC Medications  dexamethasone (DECADRON) injection 10 mg (10 mg Intramuscular Given 12/15/22 1643)    Initial Impression / Assessment and Plan / UC Course  I have reviewed the triage vital signs and the nursing notes.  Pertinent labs & imaging results that were available during my care of the patient were reviewed by me and considered in my medical decision making (see chart for details).    Poison ivy dermatitis.  Treating today with injection of dexamethasone.  Starting prednisone taper tomorrow.  Instructed patient to take Zyrtec daily for 7 to 14 days.  Education provided on poison ivy dermatitis.  Instructed patient to follow up with her PCP if her symptoms are not improving.  She agrees to plan of care.    Final Clinical Impressions(s) / UC Diagnoses   Final diagnoses:  Poison ivy dermatitis     Discharge Instructions      You were given an injection of a steroid called dexamethasone.  Start the prednisone taper tomorrow as directed.    Take Zyrtec as directed.    Follow up with your primary care provider.       ED Prescriptions      Medication Sig Dispense Auth. Provider   predniSONE (STERAPRED UNI-PAK 21 TAB) 10 MG (21) TBPK tablet Take by mouth daily. As directed  21 tablet Mickie Bail, NP      PDMP not reviewed this encounter.   Mickie Bail, NP 12/15/22 [phone]

## 2022-12-15 NOTE — ED Triage Notes (Signed)
Patient to Urgent Care with complaints of rash present to legs/ arms/ eye.  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   Occurred yesterday.

## 2023-01-01 DIAGNOSIS — M25562 Pain in left knee: Secondary | ICD-10-CM | POA: Diagnosis not present

## 2023-01-11 ENCOUNTER — Other Ambulatory Visit: Payer: Self-pay | Admitting: Physician Assistant

## 2023-01-11 ENCOUNTER — Other Ambulatory Visit: Payer: Self-pay

## 2023-01-11 ENCOUNTER — Ambulatory Visit
Admission: RE | Admit: 2023-01-11 | Discharge: 2023-01-11 | Disposition: A | Discharge status: Home / Self Care | Payer: 59 | Source: Ambulatory Visit | Attending: Physician Assistant | Admitting: Physician Assistant

## 2023-01-11 DIAGNOSIS — R2242 Localized swelling, mass and lump, left lower limb: Secondary | ICD-10-CM

## 2023-01-11 DIAGNOSIS — M7121 Synovial cyst of popliteal space [Baker], right knee: Secondary | ICD-10-CM | POA: Diagnosis not present

## 2023-01-11 DIAGNOSIS — M25562 Pain in left knee: Secondary | ICD-10-CM | POA: Diagnosis not present

## 2023-01-11 DIAGNOSIS — M25462 Effusion, left knee: Secondary | ICD-10-CM | POA: Diagnosis not present

## 2023-01-15 DIAGNOSIS — M25462 Effusion, left knee: Secondary | ICD-10-CM | POA: Diagnosis not present

## 2023-01-15 DIAGNOSIS — M25562 Pain in left knee: Secondary | ICD-10-CM | POA: Diagnosis not present

## 2023-01-19 DIAGNOSIS — M25462 Effusion, left knee: Secondary | ICD-10-CM | POA: Diagnosis not present

## 2023-01-19 DIAGNOSIS — M23332 Other meniscus derangements, other medial meniscus, left knee: Secondary | ICD-10-CM | POA: Diagnosis not present

## 2023-01-19 DIAGNOSIS — M1712 Unilateral primary osteoarthritis, left knee: Secondary | ICD-10-CM | POA: Diagnosis not present

## 2023-01-19 DIAGNOSIS — M25562 Pain in left knee: Secondary | ICD-10-CM | POA: Diagnosis not present

## 2023-02-04 DIAGNOSIS — M25662 Stiffness of left knee, not elsewhere classified: Secondary | ICD-10-CM | POA: Diagnosis not present

## 2023-02-04 DIAGNOSIS — M25562 Pain in left knee: Secondary | ICD-10-CM | POA: Diagnosis not present

## 2023-02-22 DIAGNOSIS — M25662 Stiffness of left knee, not elsewhere classified: Secondary | ICD-10-CM | POA: Diagnosis not present

## 2023-02-22 DIAGNOSIS — M25562 Pain in left knee: Secondary | ICD-10-CM | POA: Diagnosis not present

## 2023-02-24 DIAGNOSIS — M25562 Pain in left knee: Secondary | ICD-10-CM | POA: Diagnosis not present

## 2023-02-24 DIAGNOSIS — M25662 Stiffness of left knee, not elsewhere classified: Secondary | ICD-10-CM | POA: Diagnosis not present

## 2023-03-10 DIAGNOSIS — M25462 Effusion, left knee: Secondary | ICD-10-CM | POA: Diagnosis not present

## 2023-03-19 DIAGNOSIS — S83282A Other tear of lateral meniscus, current injury, left knee, initial encounter: Secondary | ICD-10-CM | POA: Diagnosis not present

## 2023-03-19 DIAGNOSIS — M25562 Pain in left knee: Secondary | ICD-10-CM | POA: Diagnosis not present

## 2023-03-19 DIAGNOSIS — M25662 Stiffness of left knee, not elsewhere classified: Secondary | ICD-10-CM | POA: Diagnosis not present

## 2023-03-19 DIAGNOSIS — S83242A Other tear of medial meniscus, current injury, left knee, initial encounter: Secondary | ICD-10-CM | POA: Diagnosis not present

## 2023-04-07 DIAGNOSIS — S83207D Unspecified tear of unspecified meniscus, current injury, left knee, subsequent encounter: Secondary | ICD-10-CM | POA: Diagnosis not present

## 2023-04-13 DIAGNOSIS — I10 Essential (primary) hypertension: Secondary | ICD-10-CM | POA: Diagnosis not present

## 2023-04-13 DIAGNOSIS — Z6827 Body mass index (BMI) 27.0-27.9, adult: Secondary | ICD-10-CM | POA: Diagnosis not present

## 2023-04-19 DIAGNOSIS — M94262 Chondromalacia, left knee: Secondary | ICD-10-CM | POA: Diagnosis not present

## 2023-04-19 DIAGNOSIS — S83282A Other tear of lateral meniscus, current injury, left knee, initial encounter: Secondary | ICD-10-CM | POA: Diagnosis not present

## 2023-04-19 DIAGNOSIS — S83232A Complex tear of medial meniscus, current injury, left knee, initial encounter: Secondary | ICD-10-CM | POA: Diagnosis not present

## 2023-04-22 ENCOUNTER — Ambulatory Visit: Payer: 59 | Admitting: Pediatrics

## 2023-04-28 ENCOUNTER — Ambulatory Visit: Payer: Self-pay | Admitting: Pediatrics

## 2023-04-28 DIAGNOSIS — S83282A Other tear of lateral meniscus, current injury, left knee, initial encounter: Secondary | ICD-10-CM | POA: Diagnosis not present

## 2023-05-10 DIAGNOSIS — M25562 Pain in left knee: Secondary | ICD-10-CM | POA: Diagnosis not present

## 2023-05-10 DIAGNOSIS — R600 Localized edema: Secondary | ICD-10-CM | POA: Diagnosis not present

## 2023-05-11 DIAGNOSIS — S83207D Unspecified tear of unspecified meniscus, current injury, left knee, subsequent encounter: Secondary | ICD-10-CM | POA: Diagnosis not present

## 2023-05-11 DIAGNOSIS — R262 Difficulty in walking, not elsewhere classified: Secondary | ICD-10-CM | POA: Diagnosis not present

## 2023-05-13 DIAGNOSIS — R600 Localized edema: Secondary | ICD-10-CM | POA: Diagnosis not present

## 2023-05-13 DIAGNOSIS — M25562 Pain in left knee: Secondary | ICD-10-CM | POA: Diagnosis not present

## 2023-05-17 DIAGNOSIS — M25562 Pain in left knee: Secondary | ICD-10-CM | POA: Diagnosis not present

## 2023-05-17 DIAGNOSIS — R600 Localized edema: Secondary | ICD-10-CM | POA: Diagnosis not present

## 2023-05-19 DIAGNOSIS — Z6827 Body mass index (BMI) 27.0-27.9, adult: Secondary | ICD-10-CM | POA: Diagnosis not present

## 2023-05-19 DIAGNOSIS — B3731 Acute candidiasis of vulva and vagina: Secondary | ICD-10-CM | POA: Diagnosis not present

## 2023-05-21 DIAGNOSIS — M25562 Pain in left knee: Secondary | ICD-10-CM | POA: Diagnosis not present

## 2023-05-21 DIAGNOSIS — R600 Localized edema: Secondary | ICD-10-CM | POA: Diagnosis not present

## 2023-05-24 DIAGNOSIS — R600 Localized edema: Secondary | ICD-10-CM | POA: Diagnosis not present

## 2023-05-24 DIAGNOSIS — M25562 Pain in left knee: Secondary | ICD-10-CM | POA: Diagnosis not present

## 2023-05-28 ENCOUNTER — Ambulatory Visit: Payer: 59 | Admitting: Pediatrics

## 2023-05-31 DIAGNOSIS — R600 Localized edema: Secondary | ICD-10-CM | POA: Diagnosis not present

## 2023-05-31 DIAGNOSIS — M25562 Pain in left knee: Secondary | ICD-10-CM | POA: Diagnosis not present

## 2023-06-02 DIAGNOSIS — R600 Localized edema: Secondary | ICD-10-CM | POA: Diagnosis not present

## 2023-06-02 DIAGNOSIS — M25562 Pain in left knee: Secondary | ICD-10-CM | POA: Diagnosis not present

## 2023-06-03 DIAGNOSIS — M25562 Pain in left knee: Secondary | ICD-10-CM | POA: Diagnosis not present

## 2023-06-14 DIAGNOSIS — Z6829 Body mass index (BMI) 29.0-29.9, adult: Secondary | ICD-10-CM | POA: Diagnosis not present

## 2023-06-14 DIAGNOSIS — Z0130 Encounter for examination of blood pressure without abnormal findings: Secondary | ICD-10-CM | POA: Diagnosis not present

## 2023-06-14 DIAGNOSIS — Z789 Other specified health status: Secondary | ICD-10-CM | POA: Diagnosis not present

## 2023-07-12 DIAGNOSIS — Z6824 Body mass index (BMI) 24.0-24.9, adult: Secondary | ICD-10-CM | POA: Diagnosis not present

## 2023-07-12 DIAGNOSIS — Z09 Encounter for follow-up examination after completed treatment for conditions other than malignant neoplasm: Secondary | ICD-10-CM | POA: Diagnosis not present

## 2023-07-12 DIAGNOSIS — I10 Essential (primary) hypertension: Secondary | ICD-10-CM | POA: Diagnosis not present

## 2023-07-19 DIAGNOSIS — M25562 Pain in left knee: Secondary | ICD-10-CM | POA: Diagnosis not present

## 2023-07-19 DIAGNOSIS — S7001XA Contusion of right hip, initial encounter: Secondary | ICD-10-CM | POA: Diagnosis not present

## 2024-02-11 DIAGNOSIS — Z130 Encounter for screening for diseases of the blood and blood-forming organs and certain disorders involving the immune mechanism: Secondary | ICD-10-CM | POA: Diagnosis not present

## 2024-02-11 DIAGNOSIS — Z1329 Encounter for screening for other suspected endocrine disorder: Secondary | ICD-10-CM | POA: Diagnosis not present

## 2024-02-11 DIAGNOSIS — Z13220 Encounter for screening for lipoid disorders: Secondary | ICD-10-CM | POA: Diagnosis not present

## 2024-02-11 DIAGNOSIS — Z131 Encounter for screening for diabetes mellitus: Secondary | ICD-10-CM | POA: Diagnosis not present

## 2024-02-11 DIAGNOSIS — Z1211 Encounter for screening for malignant neoplasm of colon: Secondary | ICD-10-CM | POA: Diagnosis not present

## 2024-02-11 DIAGNOSIS — Z0000 Encounter for general adult medical examination without abnormal findings: Secondary | ICD-10-CM | POA: Diagnosis not present

## 2024-02-11 DIAGNOSIS — L309 Dermatitis, unspecified: Secondary | ICD-10-CM | POA: Diagnosis not present

## 2024-04-11 DIAGNOSIS — Z13220 Encounter for screening for lipoid disorders: Secondary | ICD-10-CM | POA: Diagnosis not present

## 2024-04-11 DIAGNOSIS — Z131 Encounter for screening for diabetes mellitus: Secondary | ICD-10-CM | POA: Diagnosis not present

## 2024-04-11 DIAGNOSIS — Z0000 Encounter for general adult medical examination without abnormal findings: Secondary | ICD-10-CM | POA: Diagnosis not present
